# Patient Record
Sex: Female | Born: 1945 | Race: Black or African American | Hispanic: No | Smoking: Never smoker
Health system: Southern US, Community
[De-identification: ages and names within clinical notes are randomized; demographics above are authoritative.]

## PROBLEM LIST (undated history)

## (undated) DIAGNOSIS — I10 Essential (primary) hypertension: Secondary | ICD-10-CM

## (undated) DIAGNOSIS — M1990 Unspecified osteoarthritis, unspecified site: Secondary | ICD-10-CM

## (undated) DIAGNOSIS — E785 Hyperlipidemia, unspecified: Secondary | ICD-10-CM

## (undated) HISTORY — DX: Unspecified osteoarthritis, unspecified site: M19.90

## (undated) HISTORY — PX: ABDOMINAL HYSTERECTOMY: SHX81

## (undated) HISTORY — PX: BREAST BIOPSY: SHX20

---

## 2008-06-12 ENCOUNTER — Ambulatory Visit: Payer: Self-pay | Admitting: Internal Medicine

## 2009-06-14 ENCOUNTER — Ambulatory Visit: Payer: Self-pay | Admitting: Internal Medicine

## 2010-06-17 ENCOUNTER — Ambulatory Visit: Payer: Self-pay | Admitting: Internal Medicine

## 2011-05-12 ENCOUNTER — Ambulatory Visit: Payer: Self-pay | Admitting: Gastroenterology

## 2011-05-14 LAB — PATHOLOGY REPORT

## 2011-07-23 ENCOUNTER — Ambulatory Visit: Payer: Self-pay | Admitting: Internal Medicine

## 2012-07-23 ENCOUNTER — Ambulatory Visit: Payer: Self-pay | Admitting: Internal Medicine

## 2013-07-26 ENCOUNTER — Ambulatory Visit: Payer: Self-pay | Admitting: Internal Medicine

## 2014-05-07 DIAGNOSIS — D72819 Decreased white blood cell count, unspecified: Secondary | ICD-10-CM | POA: Insufficient documentation

## 2014-05-07 DIAGNOSIS — E538 Deficiency of other specified B group vitamins: Secondary | ICD-10-CM | POA: Insufficient documentation

## 2014-05-07 DIAGNOSIS — E7800 Pure hypercholesterolemia, unspecified: Secondary | ICD-10-CM | POA: Insufficient documentation

## 2014-05-07 DIAGNOSIS — M170 Bilateral primary osteoarthritis of knee: Secondary | ICD-10-CM | POA: Insufficient documentation

## 2014-09-26 ENCOUNTER — Ambulatory Visit: Payer: Self-pay | Admitting: Internal Medicine

## 2015-05-21 ENCOUNTER — Ambulatory Visit
Admission: EM | Admit: 2015-05-21 | Discharge: 2015-05-21 | Disposition: A | Discharge status: Home / Self Care | Payer: Medicare Other | Attending: Family Medicine | Admitting: Family Medicine

## 2015-05-21 DIAGNOSIS — M5431 Sciatica, right side: Secondary | ICD-10-CM

## 2015-05-21 HISTORY — DX: Hyperlipidemia, unspecified: E78.5

## 2015-05-21 HISTORY — DX: Essential (primary) hypertension: I10

## 2015-05-21 MED ORDER — HYDROCODONE-ACETAMINOPHEN 5-325 MG PO TABS: 1.0000 | ORAL_TABLET | Freq: Every evening | ORAL | Status: DC | PRN

## 2015-05-21 MED ORDER — PREDNISONE 20 MG PO TABS: 20.0000 mg | ORAL_TABLET | Freq: Every day | ORAL | Status: DC

## 2015-05-21 NOTE — ED Provider Notes (Signed)
CSN: [id number]     Arrival date & time 05/21/15  1828 History   First MD Initiated Contact with Patient 05/21/15 1922     Chief Complaint  Patient presents with  . Back Pain   (Consider location/radiation/quality/duration/timing/severity/associated sxs/prior Treatment) HPI Comments: 69 yo female with a 1 week h/o right lower back pain radiating down the right buttock. Denies any fall/injuries, trauma, fevers, chills, numbness/tingling. 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   The history is provided by the patient.    Past Medical History  Diagnosis Date  . Hypertension   . Hyperlipemia    Past Surgical History  Procedure Laterality Date  . Abdominal hysterectomy     No family history on file. Social History  Substance Use Topics  . Smoking status: Never Smoker   . Smokeless tobacco: None  . Alcohol Use: No   OB History    No data available     Review of Systems  Allergies  Review of patient's allergies indicates no known allergies.  Home Medications   Prior to Admission medications   Medication Sig Start Date End Date Taking? Authorizing Provider  calcium citrate-vitamin D (CITRACAL+D) 315-200 MG-UNIT tablet Take 1 tablet by mouth 2 (two) times daily.   Yes Historical Provider, MD  losartan-hydrochlorothiazide (HYZAAR) 100-25 MG tablet Take 1 tablet by mouth daily.   Yes Historical Provider, MD  simvastatin (ZOCOR) 10 MG tablet Take 10 mg by mouth daily.   Yes Historical Provider, MD  Thiamine HCl (VITAMIN B-1) 250 MG tablet Take 250 mg by mouth daily.   Yes Historical Provider, MD  verapamil (VERELAN PM) 240 MG 24 hr capsule Take 240 mg by mouth at bedtime.   Yes Historical Provider, MD  HYDROcodone-acetaminophen (NORCO/VICODIN) 5-325 MG tablet Take 1-2 tablets by mouth at bedtime as needed for severe pain. 05/21/15   Barbaraann Barthelina A Betancourt, NP  predniSONE (DELTASONE) 20 MG tablet Take 1 tablet (20 mg total) by mouth daily. 05/21/15   Payton Mccallumrlando Herb Beltre, MD   Meds  Ordered and Administered this Visit  Medications - No data to display  BP 130/79 mmHg  Pulse 70  Temp(Src) 98.1 F (36.7 C) (Oral)  Resp 16  Ht 5\' 7"  (1.702 m)  Wt 235 lb (106.595 kg)  BMI 36.80 kg/m2  SpO2 98% No data found.   Physical Exam  Constitutional: She appears well-developed and well-nourished. No distress.  Musculoskeletal: She exhibits no edema.       Lumbar back: She exhibits tenderness. She exhibits normal range of motion, no bony tenderness, no swelling, no edema, no deformity, no laceration, no pain, no spasm and normal pulse.  Tenderness to palpation over the right lumbar sacral paraspinous muscles and right buttock  Skin: No rash noted. She is not diaphoretic.  Nursing note and vitals reviewed.   ED Course  Procedures (including critical care time)  Labs Review Labs Reviewed - No data to display  Imaging Review No results found.   Visual Acuity Review  Right Eye Distance:   Left Eye Distance:   Bilateral Distance:    Right Eye Near:   Left Eye Near:    Bilateral Near:         MDM   1. Sciatica, right     Discharge Medication List as of 05/21/2015  7:45 PM    START taking these medications   Details  HYDROcodone-acetaminophen (NORCO/VICODIN) 5-325 MG tablet Take 1-2 tablets by mouth at bedtime as needed for severe pain., Starting 05/21/2015, Until  Discontinued, Print    predniSONE (DELTASONE) 20 MG tablet Take 1 tablet (20 mg total) by mouth daily., Starting 05/21/2015, Until Discontinued, Normal      1.  diagnosis reviewed with patient 2. rx as per orders above; reviewed possible side effects, interactions, risks and benefits  3. Recommend supportive treatment with otc analgesics prn, rest, heat 4. Follow prn if symptoms worsen or don't improve  Payton Mccallum, MD 05/23/15 2132

## 2015-05-21 NOTE — ED Notes (Signed)
Pt states "I have right lower back pain that is shooting down my right leg. I was doing a lot of heavy lifting with my son who had cancer, he passed away 9-1."

## 2015-07-12 ENCOUNTER — Other Ambulatory Visit: Payer: Self-pay | Admitting: Internal Medicine

## 2015-07-12 DIAGNOSIS — Z1231 Encounter for screening mammogram for malignant neoplasm of breast: Secondary | ICD-10-CM

## 2015-07-12 DIAGNOSIS — Z6836 Body mass index (BMI) 36.0-36.9, adult: Secondary | ICD-10-CM | POA: Insufficient documentation

## 2015-10-02 ENCOUNTER — Ambulatory Visit
Admission: RE | Admit: 2015-10-02 | Discharge: 2015-10-02 | Disposition: A | Discharge status: Home / Self Care | Payer: Commercial Managed Care - HMO | Source: Ambulatory Visit | Attending: Internal Medicine | Admitting: Internal Medicine

## 2015-10-02 DIAGNOSIS — Z1231 Encounter for screening mammogram for malignant neoplasm of breast: Secondary | ICD-10-CM | POA: Diagnosis not present

## 2016-01-10 DIAGNOSIS — M170 Bilateral primary osteoarthritis of knee: Secondary | ICD-10-CM | POA: Diagnosis not present

## 2016-01-10 DIAGNOSIS — I10 Essential (primary) hypertension: Secondary | ICD-10-CM | POA: Diagnosis not present

## 2016-01-10 DIAGNOSIS — Z780 Asymptomatic menopausal state: Secondary | ICD-10-CM | POA: Diagnosis not present

## 2016-01-10 DIAGNOSIS — E538 Deficiency of other specified B group vitamins: Secondary | ICD-10-CM | POA: Diagnosis not present

## 2016-01-10 DIAGNOSIS — Z79899 Other long term (current) drug therapy: Secondary | ICD-10-CM | POA: Diagnosis not present

## 2016-01-10 DIAGNOSIS — E7800 Pure hypercholesterolemia, unspecified: Secondary | ICD-10-CM | POA: Diagnosis not present

## 2016-01-25 DIAGNOSIS — Z780 Asymptomatic menopausal state: Secondary | ICD-10-CM | POA: Diagnosis not present

## 2016-01-25 LAB — HM DEXA SCAN: HM Dexa Scan: NORMAL

## 2016-02-11 DIAGNOSIS — M8589 Other specified disorders of bone density and structure, multiple sites: Secondary | ICD-10-CM | POA: Insufficient documentation

## 2016-02-11 DIAGNOSIS — M810 Age-related osteoporosis without current pathological fracture: Secondary | ICD-10-CM | POA: Insufficient documentation

## 2016-07-14 ENCOUNTER — Other Ambulatory Visit: Payer: Self-pay | Admitting: Internal Medicine

## 2016-07-14 DIAGNOSIS — Z1231 Encounter for screening mammogram for malignant neoplasm of breast: Secondary | ICD-10-CM

## 2016-07-14 DIAGNOSIS — Z79899 Other long term (current) drug therapy: Secondary | ICD-10-CM | POA: Diagnosis not present

## 2016-07-14 DIAGNOSIS — D72819 Decreased white blood cell count, unspecified: Secondary | ICD-10-CM | POA: Diagnosis not present

## 2016-07-14 DIAGNOSIS — E7800 Pure hypercholesterolemia, unspecified: Secondary | ICD-10-CM | POA: Diagnosis not present

## 2016-07-14 DIAGNOSIS — I10 Essential (primary) hypertension: Secondary | ICD-10-CM | POA: Diagnosis not present

## 2016-07-14 DIAGNOSIS — Z23 Encounter for immunization: Secondary | ICD-10-CM | POA: Diagnosis not present

## 2016-07-14 DIAGNOSIS — E538 Deficiency of other specified B group vitamins: Secondary | ICD-10-CM | POA: Diagnosis not present

## 2016-10-02 ENCOUNTER — Ambulatory Visit
Admission: RE | Admit: 2016-10-02 | Discharge: 2016-10-02 | Disposition: A | Discharge status: Home / Self Care | Payer: Medicare HMO | Source: Ambulatory Visit | Attending: Internal Medicine | Admitting: Internal Medicine

## 2016-10-02 DIAGNOSIS — Z1231 Encounter for screening mammogram for malignant neoplasm of breast: Secondary | ICD-10-CM | POA: Diagnosis not present

## 2016-12-25 DIAGNOSIS — N390 Urinary tract infection, site not specified: Secondary | ICD-10-CM | POA: Diagnosis not present

## 2017-01-12 DIAGNOSIS — Z79899 Other long term (current) drug therapy: Secondary | ICD-10-CM | POA: Diagnosis not present

## 2017-01-12 DIAGNOSIS — E538 Deficiency of other specified B group vitamins: Secondary | ICD-10-CM | POA: Diagnosis not present

## 2017-01-12 DIAGNOSIS — M170 Bilateral primary osteoarthritis of knee: Secondary | ICD-10-CM | POA: Diagnosis not present

## 2017-01-12 DIAGNOSIS — I10 Essential (primary) hypertension: Secondary | ICD-10-CM | POA: Diagnosis not present

## 2017-01-12 DIAGNOSIS — E7800 Pure hypercholesterolemia, unspecified: Secondary | ICD-10-CM | POA: Diagnosis not present

## 2017-01-12 DIAGNOSIS — D72819 Decreased white blood cell count, unspecified: Secondary | ICD-10-CM | POA: Diagnosis not present

## 2017-06-03 ENCOUNTER — Encounter: Payer: Self-pay | Admitting: *Deleted

## 2017-06-03 ENCOUNTER — Ambulatory Visit
Admission: EM | Admit: 2017-06-03 | Discharge: 2017-06-03 | Disposition: A | Discharge status: Home / Self Care | Payer: Medicare HMO | Attending: Family Medicine | Admitting: Family Medicine

## 2017-06-03 DIAGNOSIS — R319 Hematuria, unspecified: Secondary | ICD-10-CM

## 2017-06-03 DIAGNOSIS — N390 Urinary tract infection, site not specified: Secondary | ICD-10-CM

## 2017-06-03 LAB — URINALYSIS, COMPLETE (UACMP) WITH MICROSCOPIC
Bilirubin Urine: NEGATIVE
Glucose, UA: NEGATIVE mg/dL
KETONES UR: NEGATIVE mg/dL
Nitrite: NEGATIVE
PROTEIN: NEGATIVE mg/dL
Specific Gravity, Urine: 1.025 (ref 1.005–1.030)
pH: 6 (ref 5.0–8.0)

## 2017-06-03 MED ORDER — NITROFURANTOIN MONOHYD MACRO 100 MG PO CAPS: 100.0000 mg | ORAL_CAPSULE | Freq: Two times a day (BID) | ORAL | 0 refills | Status: DC

## 2017-06-03 NOTE — ED Provider Notes (Signed)
[facility]    CSN: [id number] Arrival date & time: 06/03/17  0911     History   Chief Complaint Chief Complaint  Patient presents with  . Dysuria    HPI Elizabeth Franco is a 71 y.o. female.   Patient is a 71 year old female who presents with burning with urination that started on Sunday. Patient ports of burning is worse at the beginning. She is drinking the juice and water without much improvement. Patient denies abdominal pain nausea and vomiting. Her last UTI was in May that she attributes to delaying urination due to travel. She is taken no over-the-counter medications for this and has no allergies to medications. She reports is no blood or foul odor to her urine just that is yellow and not clear.      Past Medical History:  Diagnosis Date  . Hyperlipemia   . Hypertension     There are no active problems to display for this patient.   Past Surgical History:  Procedure Laterality Date  . ABDOMINAL HYSTERECTOMY    . BREAST BIOPSY Left    neg    OB History    No data available       Home Medications    Prior to Admission medications   Medication Sig Start Date End Date Taking? Authorizing Provider  calcium citrate-vitamin D (CITRACAL+D) 315-200 MG-UNIT tablet Take 1 tablet by mouth 2 (two) times daily.   Yes [provider]  losartan-hydrochlorothiazide (HYZAAR) 100-25 MG tablet Take 1 tablet by mouth daily.   Yes [provider]  simvastatin (ZOCOR) 10 MG tablet Take 10 mg by mouth daily.   Yes [provider]  Thiamine HCl (VITAMIN B-1) 250 MG tablet Take 250 mg by mouth daily.   Yes [provider]  HYDROcodone-acetaminophen (NORCO/VICODIN) 5-325 MG tablet Take 1-2 tablets by mouth at bedtime as needed for severe pain. 05/21/15   Betancourt, Jarold Song, NP  nitrofurantoin, macrocrystal-monohydrate, (MACROBID) 100 MG capsule Take 1 capsule (100 mg total) by mouth 2 (two) times daily. 06/03/17   Candis Schatz, PA-C  predniSONE (DELTASONE) 20 MG tablet Take 1 tablet (20 mg total) by mouth daily. 05/21/15   Payton Mccallum, MD  verapamil (VERELAN PM) 240 MG 24 hr capsule Take 240 mg by mouth at bedtime.    [provider]    Family History Family History  Problem Relation Age of Onset  . Breast cancer Mother 67  . Breast cancer Maternal Aunt   . Breast cancer Maternal Grandmother 15    Social History Social History  Substance Use Topics  . Smoking status: Never Smoker  . Smokeless tobacco: Never Used  . Alcohol use No     Allergies   Patient has no known allergies.   Review of Systems Review of Systems  As noted above in history of present illness. Other systems reviewed and found to be negative.   Physical Exam Triage Vital Signs ED Triage Vitals  Enc Vitals Group     BP      Pulse      Resp      Temp      Temp src      SpO2      Weight      Height      Head Circumference      Peak Flow      Pain Score      Pain Loc      Pain Edu?  Excl. in GC?    No data found.   Updated Vital Signs BP 119/73 (BP Location: Left Arm)   Pulse 95   Temp 98.2 F (36.8 C) (Oral)   Resp 14   Ht 5\' 7"  (1.702 m)   Wt 230 lb (104.3 kg)   SpO2 98%   BMI 36.02 kg/m    Physical Exam  Constitutional: She is oriented to person, place, and time. She appears well-developed and well-nourished. No distress.  HENT:  Head: Atraumatic.  Eyes: Pupils are equal, round, and reactive to light. EOM are normal.  Neck: Normal range of motion.  Cardiovascular: Normal rate, regular rhythm and normal heart sounds.   No murmur heard. Pulmonary/Chest: Effort normal. No respiratory distress. She has no wheezes.  Abdominal: Soft. Bowel sounds are normal. She exhibits no distension. There is no tenderness.  Musculoskeletal: Normal range of motion. She exhibits no edema.  Neurological: She is alert and oriented to person, place, and time. No cranial nerve deficit.  Skin: Skin is warm  and dry.     UC Treatments / Results  Labs (all labs ordered are listed, but only abnormal results are displayed) Labs Reviewed  URINALYSIS, COMPLETE (UACMP) WITH MICROSCOPIC - Abnormal; Notable for the following:       Result Value   APPearance HAZY (*)    Hgb urine dipstick MODERATE (*)    Leukocytes, UA MODERATE (*)    Squamous Epithelial / LPF 0-5 (*)    Non Squamous Epithelial PRESENT (*)    Bacteria, UA RARE (*)    All other components within normal limits    EKG  EKG Interpretation None       Radiology No results found.  Procedures Procedures (including critical care time)  Medications Ordered in UC Medications - No data to display   Initial Impression / Assessment and Plan / UC Course  I have reviewed the triage vital signs and the nursing notes.  Pertinent labs & imaging results that were available during my care of the patient were reviewed by me and considered in my medical decision making (see chart for details).     Patient presents with symptoms of UTI.  Final Clinical Impressions(s) / UC Diagnoses   Final diagnoses:  Urinary tract infection with hematuria, site unspecified   Positive UTI with hematuria. Culture. Will treat with Macrobid which was successful in the past.  New Prescriptions New Prescriptions   NITROFURANTOIN, MACROCRYSTAL-MONOHYDRATE, (MACROBID) 100 MG CAPSULE    Take 1 capsule (100 mg total) by mouth 2 (two) times daily.     Controlled Substance Prescriptions [place] Controlled Substance Registry consulted? Not Applicable   Candis SchatzHarris, Hugh Kamara D, PA-C 06/03/17 1005

## 2017-06-03 NOTE — ED Triage Notes (Signed)
Pt is having pain when urinating. Symptoms started on Sunday.

## 2017-06-03 NOTE — Discharge Instructions (Signed)
-  Macrobid: one tablet twice a day for 5 days -push fluids -can use Tylenol or ibuprofen for pain -can follow up with PCP or this clinic should symptoms worsen or not improve

## 2017-06-04 LAB — URINE CULTURE

## 2017-08-06 ENCOUNTER — Other Ambulatory Visit: Payer: Self-pay | Admitting: Internal Medicine

## 2017-08-06 DIAGNOSIS — E538 Deficiency of other specified B group vitamins: Secondary | ICD-10-CM | POA: Diagnosis not present

## 2017-08-06 DIAGNOSIS — Z803 Family history of malignant neoplasm of breast: Secondary | ICD-10-CM | POA: Diagnosis not present

## 2017-08-06 DIAGNOSIS — D72819 Decreased white blood cell count, unspecified: Secondary | ICD-10-CM | POA: Diagnosis not present

## 2017-08-06 DIAGNOSIS — E7800 Pure hypercholesterolemia, unspecified: Secondary | ICD-10-CM | POA: Diagnosis not present

## 2017-08-06 DIAGNOSIS — Z1239 Encounter for other screening for malignant neoplasm of breast: Secondary | ICD-10-CM

## 2017-08-06 DIAGNOSIS — Z1231 Encounter for screening mammogram for malignant neoplasm of breast: Secondary | ICD-10-CM | POA: Diagnosis not present

## 2017-08-06 DIAGNOSIS — I10 Essential (primary) hypertension: Secondary | ICD-10-CM | POA: Diagnosis not present

## 2017-08-06 DIAGNOSIS — Z0000 Encounter for general adult medical examination without abnormal findings: Secondary | ICD-10-CM | POA: Diagnosis not present

## 2017-08-06 DIAGNOSIS — M170 Bilateral primary osteoarthritis of knee: Secondary | ICD-10-CM | POA: Diagnosis not present

## 2017-08-06 DIAGNOSIS — Z79899 Other long term (current) drug therapy: Secondary | ICD-10-CM | POA: Diagnosis not present

## 2017-10-06 ENCOUNTER — Ambulatory Visit
Admission: RE | Admit: 2017-10-06 | Discharge: 2017-10-06 | Disposition: A | Discharge status: Home / Self Care | Payer: Medicare HMO | Source: Ambulatory Visit | Attending: Internal Medicine | Admitting: Internal Medicine

## 2017-10-06 DIAGNOSIS — Z803 Family history of malignant neoplasm of breast: Secondary | ICD-10-CM

## 2017-10-06 DIAGNOSIS — Z1231 Encounter for screening mammogram for malignant neoplasm of breast: Secondary | ICD-10-CM | POA: Insufficient documentation

## 2017-10-06 DIAGNOSIS — Z1239 Encounter for other screening for malignant neoplasm of breast: Secondary | ICD-10-CM

## 2018-01-29 ENCOUNTER — Other Ambulatory Visit: Payer: Self-pay | Admitting: Pharmacist

## 2018-01-29 NOTE — Patient Outreach (Signed)
[facility] Care Management  01/29/2018  Elizabeth Franco 1946-04-08 [id number]   Incoming call from Elizabeth CruelBetty Jean Schuld in response to the [facility] Medication Adherence Campaign. Speak with patient. HIPAA identifiers verified and verbal consent received.  Elizabeth Franco reports that she takes her simvastatin once daily as directed. Denies any missed doses or barriers to adherence. Counsel patient on the importance of adherence. Elizabeth Franco verbalizes understanding.   Patient denies any medication questions/concerns. Will close pharmacy episode at this time.  Duanne MoronElisabeth Ariannah Arenson, PharmD, [facility]ACP Clinical Pharmacist [facility] Care Management [phone]

## 2018-02-03 DIAGNOSIS — E538 Deficiency of other specified B group vitamins: Secondary | ICD-10-CM | POA: Diagnosis not present

## 2018-02-03 DIAGNOSIS — I10 Essential (primary) hypertension: Secondary | ICD-10-CM | POA: Diagnosis not present

## 2018-02-03 DIAGNOSIS — Z79899 Other long term (current) drug therapy: Secondary | ICD-10-CM | POA: Diagnosis not present

## 2018-02-03 DIAGNOSIS — E7800 Pure hypercholesterolemia, unspecified: Secondary | ICD-10-CM | POA: Diagnosis not present

## 2018-02-03 DIAGNOSIS — M170 Bilateral primary osteoarthritis of knee: Secondary | ICD-10-CM | POA: Diagnosis not present

## 2018-08-06 DIAGNOSIS — M170 Bilateral primary osteoarthritis of knee: Secondary | ICD-10-CM | POA: Diagnosis not present

## 2018-08-06 DIAGNOSIS — E538 Deficiency of other specified B group vitamins: Secondary | ICD-10-CM | POA: Diagnosis not present

## 2018-08-06 DIAGNOSIS — I10 Essential (primary) hypertension: Secondary | ICD-10-CM | POA: Diagnosis not present

## 2018-08-06 DIAGNOSIS — D72819 Decreased white blood cell count, unspecified: Secondary | ICD-10-CM | POA: Diagnosis not present

## 2018-08-06 DIAGNOSIS — Z0000 Encounter for general adult medical examination without abnormal findings: Secondary | ICD-10-CM | POA: Diagnosis not present

## 2018-08-06 DIAGNOSIS — E7800 Pure hypercholesterolemia, unspecified: Secondary | ICD-10-CM | POA: Diagnosis not present

## 2018-08-06 DIAGNOSIS — Z803 Family history of malignant neoplasm of breast: Secondary | ICD-10-CM | POA: Diagnosis not present

## 2018-08-06 DIAGNOSIS — Z79899 Other long term (current) drug therapy: Secondary | ICD-10-CM | POA: Diagnosis not present

## 2018-08-06 DIAGNOSIS — Z1231 Encounter for screening mammogram for malignant neoplasm of breast: Secondary | ICD-10-CM | POA: Diagnosis not present

## 2018-08-09 ENCOUNTER — Other Ambulatory Visit: Payer: Self-pay | Admitting: Internal Medicine

## 2018-08-09 DIAGNOSIS — Z803 Family history of malignant neoplasm of breast: Secondary | ICD-10-CM

## 2018-08-09 DIAGNOSIS — Z1231 Encounter for screening mammogram for malignant neoplasm of breast: Secondary | ICD-10-CM

## 2018-10-11 ENCOUNTER — Ambulatory Visit
Admission: RE | Admit: 2018-10-11 | Discharge: 2018-10-11 | Disposition: A | Discharge status: Home / Self Care | Payer: Medicare HMO | Source: Ambulatory Visit | Attending: Internal Medicine | Admitting: Internal Medicine

## 2018-10-11 ENCOUNTER — Other Ambulatory Visit: Payer: Self-pay

## 2018-10-11 DIAGNOSIS — Z1231 Encounter for screening mammogram for malignant neoplasm of breast: Secondary | ICD-10-CM

## 2018-10-11 DIAGNOSIS — Z803 Family history of malignant neoplasm of breast: Secondary | ICD-10-CM | POA: Insufficient documentation

## 2019-02-07 DIAGNOSIS — I10 Essential (primary) hypertension: Secondary | ICD-10-CM | POA: Diagnosis not present

## 2019-02-07 DIAGNOSIS — M170 Bilateral primary osteoarthritis of knee: Secondary | ICD-10-CM | POA: Diagnosis not present

## 2019-02-07 DIAGNOSIS — Z79899 Other long term (current) drug therapy: Secondary | ICD-10-CM | POA: Diagnosis not present

## 2019-02-07 DIAGNOSIS — M19042 Primary osteoarthritis, left hand: Secondary | ICD-10-CM | POA: Diagnosis not present

## 2019-02-07 DIAGNOSIS — E538 Deficiency of other specified B group vitamins: Secondary | ICD-10-CM | POA: Diagnosis not present

## 2019-02-07 DIAGNOSIS — E7800 Pure hypercholesterolemia, unspecified: Secondary | ICD-10-CM | POA: Diagnosis not present

## 2019-06-09 DIAGNOSIS — Z20828 Contact with and (suspected) exposure to other viral communicable diseases: Secondary | ICD-10-CM | POA: Diagnosis not present

## 2019-08-11 ENCOUNTER — Other Ambulatory Visit: Payer: Self-pay | Admitting: Internal Medicine

## 2019-08-11 DIAGNOSIS — Z1231 Encounter for screening mammogram for malignant neoplasm of breast: Secondary | ICD-10-CM

## 2019-08-11 DIAGNOSIS — E538 Deficiency of other specified B group vitamins: Secondary | ICD-10-CM | POA: Diagnosis not present

## 2019-08-11 DIAGNOSIS — M170 Bilateral primary osteoarthritis of knee: Secondary | ICD-10-CM | POA: Diagnosis not present

## 2019-08-11 DIAGNOSIS — Z0000 Encounter for general adult medical examination without abnormal findings: Secondary | ICD-10-CM | POA: Diagnosis not present

## 2019-08-11 DIAGNOSIS — Z79899 Other long term (current) drug therapy: Secondary | ICD-10-CM | POA: Diagnosis not present

## 2019-08-11 DIAGNOSIS — Z803 Family history of malignant neoplasm of breast: Secondary | ICD-10-CM | POA: Diagnosis not present

## 2019-08-11 DIAGNOSIS — M19049 Primary osteoarthritis, unspecified hand: Secondary | ICD-10-CM | POA: Diagnosis not present

## 2019-08-11 DIAGNOSIS — I10 Essential (primary) hypertension: Secondary | ICD-10-CM | POA: Diagnosis not present

## 2019-08-11 DIAGNOSIS — Z1331 Encounter for screening for depression: Secondary | ICD-10-CM | POA: Diagnosis not present

## 2019-08-12 DIAGNOSIS — Z79899 Other long term (current) drug therapy: Secondary | ICD-10-CM | POA: Diagnosis not present

## 2019-08-12 DIAGNOSIS — E7800 Pure hypercholesterolemia, unspecified: Secondary | ICD-10-CM | POA: Diagnosis not present

## 2019-08-12 DIAGNOSIS — Z1159 Encounter for screening for other viral diseases: Secondary | ICD-10-CM | POA: Diagnosis not present

## 2019-08-12 DIAGNOSIS — E538 Deficiency of other specified B group vitamins: Secondary | ICD-10-CM | POA: Diagnosis not present

## 2019-08-12 LAB — HM HEPATITIS C SCREENING LAB: HM Hepatitis Screen: NEGATIVE

## 2019-08-12 LAB — HM DEXA SCAN

## 2019-11-02 ENCOUNTER — Ambulatory Visit: Payer: Medicare HMO

## 2019-11-29 ENCOUNTER — Other Ambulatory Visit: Payer: Self-pay

## 2019-11-29 ENCOUNTER — Encounter (INDEPENDENT_AMBULATORY_CARE_PROVIDER_SITE_OTHER): Payer: Self-pay

## 2019-11-29 ENCOUNTER — Ambulatory Visit
Admission: RE | Admit: 2019-11-29 | Discharge: 2019-11-29 | Disposition: A | Discharge status: Home / Self Care | Payer: Medicare HMO | Source: Ambulatory Visit | Attending: Internal Medicine | Admitting: Internal Medicine

## 2019-11-29 DIAGNOSIS — Z803 Family history of malignant neoplasm of breast: Secondary | ICD-10-CM | POA: Diagnosis not present

## 2019-11-29 DIAGNOSIS — Z1231 Encounter for screening mammogram for malignant neoplasm of breast: Secondary | ICD-10-CM | POA: Diagnosis not present

## 2020-02-09 DIAGNOSIS — M170 Bilateral primary osteoarthritis of knee: Secondary | ICD-10-CM | POA: Diagnosis not present

## 2020-02-09 DIAGNOSIS — Z79899 Other long term (current) drug therapy: Secondary | ICD-10-CM | POA: Diagnosis not present

## 2020-02-09 DIAGNOSIS — I10 Essential (primary) hypertension: Secondary | ICD-10-CM | POA: Diagnosis not present

## 2020-02-09 DIAGNOSIS — E538 Deficiency of other specified B group vitamins: Secondary | ICD-10-CM | POA: Diagnosis not present

## 2020-02-09 DIAGNOSIS — E7800 Pure hypercholesterolemia, unspecified: Secondary | ICD-10-CM | POA: Diagnosis not present

## 2020-07-01 ENCOUNTER — Encounter: Payer: Self-pay | Admitting: Emergency Medicine

## 2020-07-01 ENCOUNTER — Ambulatory Visit
Admission: EM | Admit: 2020-07-01 | Discharge: 2020-07-01 | Disposition: A | Discharge status: Home / Self Care | Payer: Medicare HMO | Attending: Emergency Medicine | Admitting: Emergency Medicine

## 2020-07-01 ENCOUNTER — Other Ambulatory Visit: Payer: Self-pay

## 2020-07-01 DIAGNOSIS — M5431 Sciatica, right side: Secondary | ICD-10-CM | POA: Diagnosis not present

## 2020-07-01 MED ORDER — DEXAMETHASONE SODIUM PHOSPHATE 10 MG/ML IJ SOLN
10.0000 mg | Freq: Once | INTRAMUSCULAR | Status: AC
  Administered 2020-07-01: 10 mg via INTRAMUSCULAR

## 2020-07-01 MED ORDER — PREDNISONE 10 MG (21) PO TBPK: ORAL_TABLET | ORAL | 0 refills | Status: DC

## 2020-07-01 NOTE — Discharge Instructions (Signed)
You were seen for right hip pain and are being treated for sciatica.   Start your steroid pack tomorrow, 11/29.  Treat your pain with over-the-counter Tylenol, 2 650 mg tablets 2 times a day as needed.  Take care, Dr. Sharlet Salina, NP-c

## 2020-07-01 NOTE — ED Triage Notes (Signed)
Pt c/o right hip pain onset about 3 days ago. Pt states she had to carry a 14 lbs [place] up and down the stairs before symptoms began. She has used arthritis cream and tylenol arthritis with no relief.

## 2020-07-01 NOTE — ED Provider Notes (Signed)
[facility] [place]   Name: Elizabeth Franco DOB: 09-12-1945 MRN: [id number] CSN: [id number] PCP: Mickey Farber, MD  Arrival date and time:  07/01/20 1104  Chief Complaint:  Hip Pain   NOTE: Prior to seeing the patient today, I have reviewed the triage nursing documentation and vital signs. Clinical staff has updated patient's PMH/PSHx, current medication list, and drug allergies/intolerances to ensure comprehensive history available to assist in medical decision making.   History:   HPI: Elizabeth Franco is a 74 y.o. female who presents today with complaints of right-sided hip pain.  Patient states his right hip pain started after carrying a 14 pound [place] down 2 flights of stairs and into her car on Thanksgiving day.  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  She received "some medications" from this urgent care and that helped with her pain.  Patient states his pain starts at her right gluteal/hip and radiates down her leg.  She denies any numbness or weakness to her right leg.  She denies any lower back pain, or loss of bowel or bladder function.  Past Medical History:  Diagnosis Date   Hyperlipemia    Hypertension     Past Surgical History:  Procedure Laterality Date   ABDOMINAL HYSTERECTOMY     BREAST BIOPSY Left    neg    Family History  Problem Relation Age of Onset   Breast cancer Mother 15   Breast cancer Maternal Aunt    Breast cancer Maternal Grandmother 52    Social History   Tobacco Use   Smoking status: Never Smoker   Smokeless tobacco: Never Used  Substance Use Topics   Alcohol use: No   Drug use: Not on file    There are no problems to display for this patient.   Home Medications:    Current Meds  Medication Sig   Ascorbic Acid (VITAMIN C) 1000 MG tablet Take 1,000 mg by mouth daily.   calcium citrate-vitamin D (CITRACAL+D) 315-200 MG-UNIT tablet Take 1 tablet by  mouth 2 (two) times daily.   losartan-hydrochlorothiazide (HYZAAR) 100-25 MG tablet Take 1 tablet by mouth daily.   Omega-3 Fatty Acids (FISH OIL) 1000 MG CAPS Take by mouth.   simvastatin (ZOCOR) 10 MG tablet Take 10 mg by mouth daily.   verapamil (VERELAN PM) 240 MG 24 hr capsule Take 240 mg by mouth at bedtime.   vitamin B-12 (CYANOCOBALAMIN) 1000 MCG tablet Take 1,000 mcg by mouth daily.   [DISCONTINUED] HYDROcodone-acetaminophen (NORCO/VICODIN) 5-325 MG tablet Take 1-2 tablets by mouth at bedtime as needed for severe pain.    Allergies:   Patient has no known allergies.  Review of Systems (ROS): Review of Systems  Constitutional: Negative for activity change.  Musculoskeletal: Positive for arthralgias and myalgias. Negative for back pain and joint swelling.  Neurological: Negative for weakness.  All other systems reviewed and are negative.    Vital Signs: Today's Vitals   07/01/20 1124 07/01/20 1126  BP:  131/81  Pulse:  60  Temp:  98.8 F (37.1 C)  TempSrc:  Oral  SpO2:  98%  PainSc: 7      Physical Exam: Physical Exam Vitals and nursing note reviewed.  Constitutional:      Appearance: Normal appearance.  Cardiovascular:     Rate and Rhythm: Normal rate and regular rhythm.     Pulses:          Popliteal pulses  are 1+ on the right side.     Heart sounds: Murmur heard.  Systolic murmur is present with a grade of 3/6.   Pulmonary:     Breath sounds: Normal breath sounds.  Musculoskeletal:     Right hip: No tenderness or bony tenderness. Normal range of motion.     Left hip: Normal.     Comments: Tenderness to her right glued.  Full passive and active range of motion to right hip.  Skin:    General: Skin is warm and dry.  Neurological:     Mental Status: She is alert.  Psychiatric:        Mood and Affect: Mood normal.        Behavior: Behavior normal.        Thought Content: Thought content normal.      Urgent Care Treatments / Results:    LABS: PLEASE NOTE: all labs that were ordered this encounter are listed, however only abnormal results are displayed. Labs Reviewed - No data to display  EKG: -None  RADIOLOGY: No results found.  PROCEDURES: Procedures  MEDICATIONS RECEIVED THIS VISIT: Medications  dexamethasone (DECADRON) injection 10 mg (has no administration in time range)    PERTINENT CLINICAL COURSE NOTES/UPDATES:   Initial Impression / Assessment and Plan / Urgent Care Course:  Pertinent labs & imaging results that were available during my care of the patient were personally reviewed by me and considered in my medical decision making (see lab/imaging section of note for values and interpretations).  Elizabeth Franco is a 74 y.o. female who presents to [facility] today with complaints of right hip pain, diagnosed with right sciatic nerve inflammation, and treated as such with the medications below. NP and patient reviewed discharge instructions below during visit.   Patient is well appearing overall in clinic today. She does not appear to be in any acute distress. Presenting symptoms (see HPI) and exam as documented above.   I have reviewed the follow up and strict return precautions for any new or worsening symptoms. Patient is aware of symptoms that would be deemed urgent/emergent, and would thus require further evaluation either here or in the emergency department. At the time of discharge, she verbalized understanding and consent with the discharge plan as it was reviewed with her. All questions were fielded by provider and/or clinic staff prior to patient discharge.    Final Clinical Impressions / Urgent Care Diagnoses:   Final diagnoses:  Sciatica of right side    New Prescriptions:  [place] Controlled Substance Registry consulted? Not Applicable  Meds ordered this encounter  Medications   dexamethasone (DECADRON) injection 10 mg   predniSONE (STERAPRED UNI-PAK 21 TAB) 10 MG (21) TBPK  tablet    Sig: Take as instructed on package (60, 50, 40, 30, 20, 10)    Dispense:  1 each    Refill:  0     Discharge Instructions     You were seen for right hip pain and are being treated for sciatica.   Start your steroid pack tomorrow, 11/29.  Treat your pain with over-the-counter Tylenol, 2 650 mg tablets 2 times a day as needed.  Take care, Dr. Sharlet Salina, NP-c     Recommended Follow up Care:  Patient encouraged to follow up with the following provider within the specified time frame, or sooner as dictated by the severity of her symptoms. As always, she was instructed that for any urgent/emergent care needs, she should seek care either here  or in the emergency department for more immediate evaluation.   Bailey Mech, DNP, NP-c    Bailey Mech, NP 07/01/20 1735

## 2020-08-14 ENCOUNTER — Other Ambulatory Visit: Payer: Self-pay | Admitting: Internal Medicine

## 2020-08-14 DIAGNOSIS — Z1231 Encounter for screening mammogram for malignant neoplasm of breast: Secondary | ICD-10-CM

## 2020-08-15 LAB — LIPID PANEL
Cholesterol: 185 (ref 0–200)
HDL: 77 — AB (ref 35–70)
LDL Cholesterol: 87
Triglycerides: 109 (ref 40–160)

## 2020-08-15 LAB — CBC AND DIFFERENTIAL
HCT: 39 (ref 36–46)
Hemoglobin: 13 (ref 12.0–16.0)
Platelets: 289 (ref 150–399)
WBC: 4.2

## 2020-08-15 LAB — COMPREHENSIVE METABOLIC PANEL
Calcium: 11 — AB (ref 8.7–10.7)
GFR calc Af Amer: 146

## 2020-08-15 LAB — BASIC METABOLIC PANEL
BUN: 10 (ref 4–21)
CO2: 27 — AB (ref 13–22)
Chloride: 105 (ref 99–108)
Creatinine: 0.5 (ref 0.5–1.1)
Potassium: 3.7 (ref 3.4–5.3)
Sodium: 138 (ref 137–147)

## 2020-08-15 LAB — VITAMIN B12: Vitamin B-12: 1500

## 2020-11-29 ENCOUNTER — Other Ambulatory Visit: Payer: Self-pay

## 2020-11-29 ENCOUNTER — Ambulatory Visit
Admission: RE | Admit: 2020-11-29 | Discharge: 2020-11-29 | Disposition: A | Discharge status: Home / Self Care | Payer: Medicare HMO | Source: Ambulatory Visit | Attending: Internal Medicine | Admitting: Internal Medicine

## 2020-11-29 DIAGNOSIS — Z1231 Encounter for screening mammogram for malignant neoplasm of breast: Secondary | ICD-10-CM | POA: Diagnosis not present

## 2021-04-19 ENCOUNTER — Encounter: Payer: Self-pay | Admitting: Internal Medicine

## 2021-04-19 ENCOUNTER — Other Ambulatory Visit: Payer: Self-pay | Admitting: Internal Medicine

## 2021-04-19 DIAGNOSIS — I10 Essential (primary) hypertension: Secondary | ICD-10-CM | POA: Insufficient documentation

## 2021-04-24 ENCOUNTER — Other Ambulatory Visit: Payer: Self-pay

## 2021-04-24 ENCOUNTER — Other Ambulatory Visit: Payer: Self-pay | Admitting: Internal Medicine

## 2021-04-24 ENCOUNTER — Ambulatory Visit (INDEPENDENT_AMBULATORY_CARE_PROVIDER_SITE_OTHER): Payer: Medicare HMO | Admitting: Internal Medicine

## 2021-04-24 ENCOUNTER — Encounter: Payer: Self-pay | Admitting: Internal Medicine

## 2021-04-24 VITALS — BP 132/72 | HR 55 | Temp 98.1°F | Ht 67.0 in | Wt 229.0 lb

## 2021-04-24 DIAGNOSIS — M170 Bilateral primary osteoarthritis of knee: Secondary | ICD-10-CM

## 2021-04-24 DIAGNOSIS — Z23 Encounter for immunization: Secondary | ICD-10-CM

## 2021-04-24 DIAGNOSIS — I10 Essential (primary) hypertension: Secondary | ICD-10-CM | POA: Diagnosis not present

## 2021-04-24 DIAGNOSIS — E7800 Pure hypercholesterolemia, unspecified: Secondary | ICD-10-CM

## 2021-04-24 MED ORDER — VERAPAMIL HCL ER 180 MG PO TBCR: 180.0000 mg | EXTENDED_RELEASE_TABLET | Freq: Every day | ORAL | 0 refills | Status: DC

## 2021-04-24 NOTE — Progress Notes (Signed)
Date:  04/24/2021   Name:  Elizabeth Franco   DOB:  [birth year]   MRN:  [id number]   Chief Complaint: Establish Care, Flu Vaccine, and Hypertension (Pt wants tablet form of verapamil to lower cost of medication )  Hypertension This is a chronic problem. The problem is controlled. Pertinent negatives include no chest pain, headaches, palpitations or shortness of breath. Past treatments include calcium channel blockers, diuretics and angiotensin blockers. The current treatment provides significant improvement. There is no history of kidney disease, CAD/MI or CVA.  Hyperlipidemia This is a chronic problem. The problem is controlled. Pertinent negatives include no chest pain or shortness of breath. Current antihyperlipidemic treatment includes statins. The current treatment provides significant improvement of lipids. There are no compliance problems.    Immunization History  Administered Date(s) Administered   Fluad Quad(high Dose 65+) 05/03/2019, 04/24/2021   Influenza, High Dose Seasonal PF 05/04/2018   Influenza-Unspecified 06/16/2015, 06/04/2016, 05/11/2020   Moderna Sars-Covid-2 Vaccination 08/21/2019, 09/18/2019, 06/06/2020, 01/30/2021   Pneumococcal Conjugate-13 08/01/2016   Pneumococcal Polysaccharide-23 11/09/2012   Tdap 01/09/2015    Lab Results  Component Value Date   CREATININE 0.5 08/15/2020   BUN 10 08/15/2020   NA 138 08/15/2020   K 3.7 08/15/2020   CL 105 08/15/2020   CO2 27 (A) 08/15/2020   Lab Results  Component Value Date   CHOL 185 08/15/2020   HDL 77 (A) 08/15/2020   LDLCALC 87 08/15/2020   TRIG 109 08/15/2020   No results found for: TSH No results found for: HGBA1C Lab Results  Component Value Date   WBC 4.2 08/15/2020   HGB 13.0 08/15/2020   HCT 39 08/15/2020   PLT 289 08/15/2020   No results found for: ALT, AST, GGT, ALKPHOS, BILITOT   Review of Systems  Constitutional:  Negative for chills, diaphoresis and unexpected weight change.  Eyes:   Negative for visual disturbance.  Respiratory:  Negative for cough, chest tightness, shortness of breath and wheezing.   Cardiovascular:  Negative for chest pain, palpitations and leg swelling.  Gastrointestinal:  Negative for abdominal pain (occasional gerd), constipation and diarrhea.  Musculoskeletal:  Positive for arthralgias (knee OA).  Neurological:  Negative for dizziness and headaches.  Psychiatric/Behavioral:  Negative for dysphoric mood and sleep disturbance. The patient is not nervous/anxious.    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  Current Meds  Medication Sig   Cholecalciferol 25 MCG (1000 UT) tablet Take by mouth.   losartan-hydrochlorothiazide (HYZAAR) 100-25 MG tablet Take 1 tablet by mouth daily.   simvastatin (ZOCOR) 10 MG tablet Take 10 mg by mouth daily.   verapamil (CALAN-SR) 180 MG CR tablet Take 1 tablet (180 mg total) by mouth at bedtime.   [DISCONTINUED] calcium citrate-vitamin  D (CITRACAL+D) 315-200 MG-UNIT tablet Take 1 tablet by mouth 2 (two) times daily.   [DISCONTINUED] Verapamil HCl CR 200 MG CP24     PHQ 2/9 Scores 04/24/2021  PHQ - 2 Score 3  PHQ- 9 Score 5    GAD 7 : Generalized Anxiety Score 04/24/2021  Nervous, Anxious, on Edge 0  Control/stop worrying 0  Worry too much -  different things 0  Trouble relaxing 0  Restless 0  Easily annoyed or irritable 0  Afraid - awful might happen 0  Total GAD 7 Score 0    BP Readings from Last 3 Encounters:  04/24/21 132/72  07/01/20 131/81  06/03/17 119/73    Physical Exam Vitals and nursing note reviewed.  Constitutional:      General: She is not in acute distress.    Appearance: Normal appearance. She is well-developed. She is obese.  HENT:     Head: Normocephalic and atraumatic.  Neck:     Vascular: No carotid bruit.  Cardiovascular:     Rate and Rhythm: Normal rate and regular rhythm.     Pulses: Normal pulses.     Heart sounds: No murmur heard. Pulmonary:     Effort: Pulmonary effort is normal. No respiratory distress.     Breath sounds: No wheezing or rhonchi.  Musculoskeletal:     Cervical back: Normal range of motion.     Right lower leg: No edema.     Left lower leg: No edema.  Lymphadenopathy:     Cervical: No cervical adenopathy.  Skin:    General: Skin is warm and dry.     Findings: No rash.  Neurological:     General: No focal deficit present.     Mental Status: She is alert and oriented to person, place, and time.  Psychiatric:        Mood and Affect: Mood normal.        Behavior: Behavior normal.    Wt Readings from Last 3 Encounters:  04/24/21 229 lb (103.9 kg)  06/03/17 230 lb (104.3 kg)  05/21/15 235 lb (106.6 kg)    BP 132/72   Pulse (!) 55   Temp 98.1 F (36.7 C) (Oral)   Ht 5\' 7"  (1.702 m)   Wt 229 lb (103.9 kg)   SpO2 96%   BMI 35.87 kg/m   Assessment and Plan: 1. Essential hypertension Clinically stable exam with well controlled BP. Tolerating medications without side effects at this time. Pt to continue current regimen and low sodium diet; benefits of regular exercise as able discussed. Will change diltiazem 200 caps to diltiazem 180 tabs with next refill. - CBC with Differential/Platelet - Comprehensive metabolic panel - TSH  2.  Hypercholesterolemia Tolerating statin medication without side effects at this time LDL is at goal of < 70 on current dose Continue same therapy without change at this time. - Lipid panel  3. Hypercalcemia Will check PTH - Parathyroid hormone, intact (no Ca)  4. Need for immunization against influenza - Flu Vaccine QUAD High Dose(Fluad)  5. Primary osteoarthritis of both knees Recommend continuing activity as tolerated Tylenol AS PRN; avoid nsaids  6. Severe obesity (BMI 35.0-39.9) with comorbidity (HCC) Remain active; prepare healthy meals and avoid fast food.   Partially dictated using 07-04-1990. Any errors are unintentional.  Animal nutritionist, MD [facility]  04/24/2021

## 2021-04-25 DIAGNOSIS — I10 Essential (primary) hypertension: Secondary | ICD-10-CM | POA: Diagnosis not present

## 2021-04-25 DIAGNOSIS — E7800 Pure hypercholesterolemia, unspecified: Secondary | ICD-10-CM | POA: Diagnosis not present

## 2021-04-26 ENCOUNTER — Other Ambulatory Visit: Payer: Self-pay

## 2021-04-26 DIAGNOSIS — R7989 Other specified abnormal findings of blood chemistry: Secondary | ICD-10-CM

## 2021-04-26 LAB — COMPREHENSIVE METABOLIC PANEL
ALT: 11 IU/L (ref 0–32)
AST: 13 IU/L (ref 0–40)
Albumin/Globulin Ratio: 1.9 (ref 1.2–2.2)
Albumin: 4.2 g/dL (ref 3.7–4.7)
Alkaline Phosphatase: 77 IU/L (ref 44–121)
BUN/Creatinine Ratio: 21 (ref 12–28)
BUN: 13 mg/dL (ref 8–27)
Bilirubin Total: 0.4 mg/dL (ref 0.0–1.2)
CO2: 20 mmol/L (ref 20–29)
Calcium: 10.8 mg/dL — ABNORMAL HIGH (ref 8.7–10.3)
Chloride: 101 mmol/L (ref 96–106)
Creatinine, Ser: 0.63 mg/dL (ref 0.57–1.00)
Globulin, Total: 2.2 g/dL (ref 1.5–4.5)
Glucose: 86 mg/dL (ref 65–99)
Potassium: 4 mmol/L (ref 3.5–5.2)
Sodium: 139 mmol/L (ref 134–144)
Total Protein: 6.4 g/dL (ref 6.0–8.5)
eGFR: 92 mL/min/{1.73_m2} (ref >59)

## 2021-04-26 LAB — CBC WITH DIFFERENTIAL/PLATELET
Basophils Absolute: 0 10*3/uL (ref 0.0–0.2)
Basos: 1 %
EOS (ABSOLUTE): 0.2 10*3/uL (ref 0.0–0.4)
Eos: 4 %
Hematocrit: 39.6 % (ref 34.0–46.6)
Hemoglobin: 13.1 g/dL (ref 11.1–15.9)
Immature Grans (Abs): 0 10*3/uL (ref 0.0–0.1)
Immature Granulocytes: 0 %
Lymphocytes Absolute: 1.4 10*3/uL (ref 0.7–3.1)
Lymphs: 29 %
MCH: 29.9 pg (ref 26.6–33.0)
MCHC: 33.1 g/dL (ref 31.5–35.7)
MCV: 90 fL (ref 79–97)
Monocytes Absolute: 0.5 10*3/uL (ref 0.1–0.9)
Monocytes: 11 %
Neutrophils Absolute: 2.6 10*3/uL (ref 1.4–7.0)
Neutrophils: 55 %
Platelets: 308 10*3/uL (ref 150–450)
RBC: 4.38 x10E6/uL (ref 3.77–5.28)
RDW: 13.5 % (ref 11.7–15.4)
WBC: 4.7 10*3/uL (ref 3.4–10.8)

## 2021-04-26 LAB — LIPID PANEL
Chol/HDL Ratio: 2.2 ratio (ref 0.0–4.4)
Cholesterol, Total: 173 mg/dL (ref 100–199)
HDL: 78 mg/dL (ref >39)
LDL Chol Calc (NIH): 77 mg/dL (ref 0–99)
Triglycerides: 101 mg/dL (ref 0–149)
VLDL Cholesterol Cal: 18 mg/dL (ref 5–40)

## 2021-04-26 LAB — PARATHYROID HORMONE, INTACT (NO CA): PTH: 31 pg/mL (ref 15–65)

## 2021-04-26 LAB — TSH: TSH: 1.54 u[IU]/mL (ref 0.450–4.500)

## 2021-07-02 ENCOUNTER — Telehealth: Payer: Self-pay | Admitting: Internal Medicine

## 2021-07-02 NOTE — Telephone Encounter (Signed)
Copied from CRM [id number]. Topic: Quick Communication - Rx Refill/Question >> Jul 02, 2021  9:58 AM Jaquita Rector A wrote: Medication: verapamil (CALAN-SR) 180 MG CR tablet  Rx from September not received  Has the patient contacted their pharmacy? No. Rx since September  (Agent: If no, request that the patient contact the pharmacy for the refill. If patient does not wish to contact the pharmacy document the reason why and proceed with request.) (Agent: If yes, when and what did the pharmacy advise?)  Preferred Pharmacy (with phone number or street name): [id number] - [place], [place] - [address] [place] [address] AT [facility] [address] [facility] [address]  Phone:  [phone] Fax:  [phone]    Has the patient been seen for an appointment in the last year OR does the patient have an upcoming appointment? Yes.    Agent: Please be advised that RX refills may take up to 3 business days. We ask that you follow-up with your pharmacy.

## 2021-07-03 NOTE — Telephone Encounter (Signed)
Requested medications are due for refill today.  A little soon  Requested medications are on the active medications list.  yes  Last refill. 04/24/2021  Future visit scheduled.   yes  Notes to clinic.  PCP is listed as Dr. Harrington Challenger

## 2021-07-18 ENCOUNTER — Other Ambulatory Visit: Payer: Self-pay

## 2021-07-18 MED ORDER — VERAPAMIL HCL ER 180 MG PO TBCR: 180.0000 mg | EXTENDED_RELEASE_TABLET | Freq: Every day | ORAL | 0 refills | Status: DC

## 2021-07-18 NOTE — Telephone Encounter (Signed)
Refill sent to mail order pharmacy.  KP

## 2021-07-18 NOTE — Telephone Encounter (Signed)
Pt states the verapamil (CALAN-SR) 180 MG CR tablet was NEVER received by the pharmacy on 04/24/21. Pt wanted the tablet.  The Rx says "print". 90 day supply Can you resend electronically to   [facility] - [place], [place] - [address]  Pt is new with Dr Asencion Partridge, and has not had anything filled by her yet.

## 2021-08-20 ENCOUNTER — Telehealth: Payer: Self-pay

## 2021-08-20 ENCOUNTER — Other Ambulatory Visit: Payer: Self-pay

## 2021-08-20 DIAGNOSIS — R7989 Other specified abnormal findings of blood chemistry: Secondary | ICD-10-CM

## 2021-08-20 NOTE — Telephone Encounter (Signed)
Copied from CRM [id number]. Topic: Referral - Status >> Aug 20, 2021  9:44 AM Marylen Ponto wrote: Reason for CRM: Pt stated the specialist that she was referred to is no longer taking new patients.

## 2021-08-26 ENCOUNTER — Encounter: Payer: Self-pay | Admitting: Internal Medicine

## 2021-08-26 ENCOUNTER — Ambulatory Visit (INDEPENDENT_AMBULATORY_CARE_PROVIDER_SITE_OTHER): Payer: Medicare HMO | Admitting: Internal Medicine

## 2021-08-26 ENCOUNTER — Other Ambulatory Visit: Payer: Self-pay

## 2021-08-26 VITALS — BP 102/72 | HR 84 | Ht 67.0 in | Wt 225.0 lb

## 2021-08-26 DIAGNOSIS — M8580 Other specified disorders of bone density and structure, unspecified site: Secondary | ICD-10-CM

## 2021-08-26 DIAGNOSIS — I10 Essential (primary) hypertension: Secondary | ICD-10-CM

## 2021-08-26 DIAGNOSIS — E213 Hyperparathyroidism, unspecified: Secondary | ICD-10-CM | POA: Diagnosis not present

## 2021-08-26 NOTE — Progress Notes (Signed)
Date:  08/26/2021   Name:  Elizabeth Franco   DOB:  [birth year]   MRN:  [id number]   Chief Complaint: Hypertension  Hypertension This is a chronic problem. The problem is controlled. Pertinent negatives include no chest pain, headaches or shortness of breath. Past treatments include diuretics, calcium channel blockers and angiotensin blockers. The current treatment provides significant improvement.  Primary hyperparathyroidism - calcium was elevated on labs in September and PTH was normal, suggesting primary HyperPTH.  Pt was referred to Endocrinology but she has transportation issues.  Lab Results  Component Value Date   NA 139 04/25/2021   K 4.0 04/25/2021   CO2 20 04/25/2021   GLUCOSE 86 04/25/2021   BUN 13 04/25/2021   CREATININE 0.63 04/25/2021   CALCIUM 10.8 (H) 04/25/2021   EGFR 92 04/25/2021   Lab Results  Component Value Date   CHOL 173 04/25/2021   HDL 78 04/25/2021   LDLCALC 77 04/25/2021   TRIG 101 04/25/2021   CHOLHDL 2.2 04/25/2021   Lab Results  Component Value Date   TSH 1.540 04/25/2021   No results found for: HGBA1C Lab Results  Component Value Date   WBC 4.7 04/25/2021   HGB 13.1 04/25/2021   HCT 39.6 04/25/2021   MCV 90 04/25/2021   PLT 308 04/25/2021   Lab Results  Component Value Date   ALT 11 04/25/2021   AST 13 04/25/2021   ALKPHOS 77 04/25/2021   BILITOT 0.4 04/25/2021   No results found for: 25OHVITD2, 25OHVITD3, VD25OH   Review of Systems  Constitutional:  Negative for chills, fatigue and fever.  Eyes:  Negative for visual disturbance.  Respiratory:  Negative for cough, chest tightness and shortness of breath.   Cardiovascular:  Negative for chest pain.  Musculoskeletal:  Positive for arthralgias.  Neurological:  Negative for dizziness and headaches.  Psychiatric/Behavioral:  Negative for dysphoric mood and sleep disturbance. The patient is not nervous/anxious.    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  Current Meds  Medication Sig   Ascorbic Acid (VITAMIN C) 1000 MG tablet Take 1,000 mg by mouth daily.   losartan-hydrochlorothiazide (HYZAAR) 100-25 MG tablet Take 1 tablet by mouth daily.   Omega-3 Fatty Acids (FISH OIL) 1000 MG CAPS Take by mouth.   simvastatin (ZOCOR) 10 MG tablet Take 10 mg by mouth daily.   verapamil (CALAN-SR) 180 MG CR tablet Take 1 tablet (180 mg total) by mouth at bedtime.   vitamin B-12 (CYANOCOBALAMIN) 500 MCG tablet Take 500 mcg by mouth daily.    PHQ 2/9 Scores 08/26/2021 04/24/2021  PHQ - 2 Score 0 3  PHQ- 9 Score 0 5    GAD 7 : Generalized Anxiety Score 08/26/2021  04/24/2021  Nervous, Anxious, on Edge 0 0  Control/stop worrying 0 0  Worry too much - different things 0 0  Trouble relaxing 0 0  Restless 0 0  Easily annoyed or irritable 0 0  Afraid - awful might happen 0 0  Total GAD 7 Score 0 0  Anxiety Difficulty Not difficult at all -    BP Readings from Last 3 Encounters:  08/26/21 102/72  04/24/21 132/72  07/01/20 131/81    Physical Exam Vitals and nursing note reviewed.  Constitutional:      General: She is not in acute distress.    Appearance: Normal appearance. She is well-developed.  HENT:     Head: Normocephalic and atraumatic.   Cardiovascular:     Rate and Rhythm: Normal rate and regular rhythm.  Pulmonary:     Effort: Pulmonary effort is normal. No respiratory distress.  Musculoskeletal:     Cervical back: Normal range of motion.     Right lower leg: No edema.     Left lower leg: No edema.  Lymphadenopathy:     Cervical: No cervical adenopathy.  Skin:    General: Skin is warm and dry.     Capillary Refill: Capillary refill takes less than 2 seconds.     Findings: No rash.  Neurological:     General: No focal deficit present.     Mental Status: She is alert and oriented to person, place, and time.  Psychiatric:        Mood and Affect: Mood normal.        Behavior: Behavior normal.    Wt Readings from Last 3 Encounters:  08/26/21 225 lb (102.1 kg)  04/24/21 229 lb (103.9 kg)  06/03/17 230 lb (104.3 kg)    BP 102/72    Pulse 84    Ht _0  (1.702 m)    Wt 225 lb (102.1 kg)    SpO2 97%    BMI 35.24 kg/m   Assessment and Plan: 1. Essential hypertension Clinically stable exam with well controlled BP. Tolerating medications without side effects at this time. Pt to continue current regimen and low sodium diet; benefits of regular exercise as able discussed. - CBC with Differential/Platelet  2. Hyperparathyroidism [facility]) She is unable to drive to [facility] or [facility] for Endo consultation. She is asymptomatic so will continue to monitor labs and exam for now. - Comprehensive metabolic panel - VITAMIN D 25 Hydroxy (Vit-D Deficiency, Fractures) - Parathyroid hormone, intact (no Ca) - Phosphorus  3. Osteopenia determined by x-ray - DG Bone Density  4. Severe obesity (BMI 35.0-39.9) with comorbidity ([place]) Encourage regular exercise such as walking 10-15 minutes per day if able. Cut back on portions at meals and limit snacks   Partially dictated using Editor, commissioning. Any errors are unintentional.  Halina Maidens, MD [facility]  08/26/2021

## 2021-08-26 NOTE — Progress Notes (Signed)
° ° °  Date:  08/26/2021   Name:  Elizabeth Franco   DOB:  11-Feb-1946   MRN:  [id number]   Chief Complaint: Hypertension  HPI  Lab Results  Component Value Date   NA 139 04/25/2021   K 4.0 04/25/2021   CO2 20 04/25/2021   GLUCOSE 86 04/25/2021   BUN 13 04/25/2021   CREATININE 0.63 04/25/2021   CALCIUM 10.8 (H) 04/25/2021   EGFR 92 04/25/2021   Lab Results  Component Value Date   CHOL 173 04/25/2021   HDL 78 04/25/2021   LDLCALC 77 04/25/2021   TRIG 101 04/25/2021   CHOLHDL 2.2 04/25/2021   Lab Results  Component Value Date   TSH 1.540 04/25/2021   No results found for: HGBA1C Lab Results  Component Value Date   WBC 4.7 04/25/2021   HGB 13.1 04/25/2021   HCT 39.6 04/25/2021   MCV 90 04/25/2021   PLT 308 04/25/2021   Lab Results  Component Value Date   ALT 11 04/25/2021   AST 13 04/25/2021   ALKPHOS 77 04/25/2021   BILITOT 0.4 04/25/2021   No results found for: 25OHVITD2, 25OHVITD3, VD25OH   Review of Systems  Patient Active Problem List   Diagnosis Date Noted   Essential hypertension 04/19/2021   Osteopenia of multiple sites 02/11/2016   Severe obesity (BMI 35.0-39.9) with comorbidity ([place]) 07/12/2015   Osteoarthritis of both knees 05/07/2014   Hypercholesterolemia 05/07/2014   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  No outpatient medications have been marked as taking for the 08/26/21 encounter (Office Visit) with [name], MD.    [facility] 2/9 Scores 04/24/2021  PHQ - 2 Score 3  PHQ- 9 Score 5    GAD 7 : Generalized Anxiety Score 04/24/2021   Nervous, Anxious, on Edge 0  Control/stop worrying 0  Worry too much - different things 0  Trouble relaxing 0  Restless 0  Easily annoyed or irritable 0  Afraid - awful might happen 0  Total GAD 7 Score 0    BP Readings from Last 3 Encounters:  04/24/21 132/72  07/01/20 131/81  06/03/17 119/73    Physical Exam  Wt Readings from Last 3 Encounters:  04/24/21 229 lb (103.9 kg)  06/03/17 230 lb (104.3 kg)  05/21/15 235 lb (106.6 kg)    Ht $R'5\' 7"'Ig$  (1.702 m)    BMI 35.87 kg/m   Assessment and Plan:

## 2021-08-27 LAB — COMPREHENSIVE METABOLIC PANEL
ALT: 17 IU/L (ref 0–32)
AST: 15 IU/L (ref 0–40)
Albumin/Globulin Ratio: 1.7 (ref 1.2–2.2)
Albumin: 4.5 g/dL (ref 3.7–4.7)
Alkaline Phosphatase: 87 IU/L (ref 44–121)
BUN/Creatinine Ratio: 16 (ref 12–28)
BUN: 12 mg/dL (ref 8–27)
Bilirubin Total: 0.4 mg/dL (ref 0.0–1.2)
CO2: 23 mmol/L (ref 20–29)
Calcium: 10.9 mg/dL — ABNORMAL HIGH (ref 8.7–10.3)
Chloride: 104 mmol/L (ref 96–106)
Creatinine, Ser: 0.74 mg/dL (ref 0.57–1.00)
Globulin, Total: 2.6 g/dL (ref 1.5–4.5)
Glucose: 90 mg/dL (ref 70–99)
Potassium: 4.2 mmol/L (ref 3.5–5.2)
Sodium: 140 mmol/L (ref 134–144)
Total Protein: 7.1 g/dL (ref 6.0–8.5)
eGFR: 84 mL/min/{1.73_m2} (ref >59)

## 2021-08-27 LAB — CBC WITH DIFFERENTIAL/PLATELET
Basophils Absolute: 0.1 10*3/uL (ref 0.0–0.2)
Basos: 1 %
EOS (ABSOLUTE): 0.2 10*3/uL (ref 0.0–0.4)
Eos: 3 %
Hematocrit: 40.9 % (ref 34.0–46.6)
Hemoglobin: 13.7 g/dL (ref 11.1–15.9)
Immature Grans (Abs): 0 10*3/uL (ref 0.0–0.1)
Immature Granulocytes: 0 %
Lymphocytes Absolute: 1.8 10*3/uL (ref 0.7–3.1)
Lymphs: 38 %
MCH: 30.2 pg (ref 26.6–33.0)
MCHC: 33.5 g/dL (ref 31.5–35.7)
MCV: 90 fL (ref 79–97)
Monocytes Absolute: 0.6 10*3/uL (ref 0.1–0.9)
Monocytes: 12 %
Neutrophils Absolute: 2.2 10*3/uL (ref 1.4–7.0)
Neutrophils: 46 %
Platelets: 307 10*3/uL (ref 150–450)
RBC: 4.54 x10E6/uL (ref 3.77–5.28)
RDW: 13.1 % (ref 11.7–15.4)
WBC: 4.8 10*3/uL (ref 3.4–10.8)

## 2021-08-27 LAB — VITAMIN D 25 HYDROXY (VIT D DEFICIENCY, FRACTURES): Vit D, 25-Hydroxy: 28.4 ng/mL — ABNORMAL LOW (ref 30.0–100.0)

## 2021-08-27 LAB — PHOSPHORUS: Phosphorus: 2.8 mg/dL — ABNORMAL LOW (ref 3.0–4.3)

## 2021-08-27 LAB — PARATHYROID HORMONE, INTACT (NO CA): PTH: 41 pg/mL (ref 15–65)

## 2021-09-09 ENCOUNTER — Ambulatory Visit (INDEPENDENT_AMBULATORY_CARE_PROVIDER_SITE_OTHER): Payer: Medicare HMO

## 2021-09-09 ENCOUNTER — Other Ambulatory Visit: Payer: Self-pay

## 2021-09-09 VITALS — BP 112/78 | HR 68 | Temp 98.5°F | Resp 16 | Ht 67.0 in | Wt 227.4 lb

## 2021-09-09 DIAGNOSIS — Z0000 Encounter for general adult medical examination without abnormal findings: Secondary | ICD-10-CM | POA: Diagnosis not present

## 2021-09-09 DIAGNOSIS — Z0100 Encounter for examination of eyes and vision without abnormal findings: Secondary | ICD-10-CM | POA: Diagnosis not present

## 2021-09-09 NOTE — Progress Notes (Signed)
Subjective:   Elizabeth Franco is a 76 y.o. female who presents for Medicare Annual (Subsequent) preventive examination.  Review of Systems     Cardiac Risk Factors include: advanced age (>47men, >18 women);dyslipidemia;hypertension;obesity (BMI >30kg/m2)     Objective:    Today's Vitals   09/09/21 1054  BP: 112/78  Pulse: 68  Resp: 16  Temp: 98.5 F (36.9 C)  TempSrc: Oral  SpO2: 98%  Weight: 227 lb 6.4 oz (103.1 kg)  Height: 5\' 7"  (1.702 m)   Body mass index is 35.62 kg/m.  Advanced Directives 09/09/2021 06/03/2017  Does Patient Have a Medical Advance Directive? No No  Would patient like information on creating a medical advance directive? Yes (MAU/Ambulatory/Procedural Areas - Information given) -    Current Medications (verified) Outpatient Encounter Medications as of 09/09/2021  Medication Sig   Ascorbic Acid (VITAMIN C) 1000 MG tablet Take 1,000 mg by mouth daily.   cholecalciferol (VITAMIN D3) 25 MCG (1000 UNIT) tablet Take 1,000 Units by mouth daily.   losartan-hydrochlorothiazide (HYZAAR) 100-25 MG tablet Take 1 tablet by mouth daily.   Omega-3 Fatty Acids (FISH OIL) 1000 MG CAPS Take by mouth.   simvastatin (ZOCOR) 10 MG tablet Take 10 mg by mouth daily.   verapamil (CALAN-SR) 180 MG CR tablet Take 1 tablet (180 mg total) by mouth at bedtime.   vitamin B-12 (CYANOCOBALAMIN) 500 MCG tablet Take 500 mcg by mouth daily.   No facility-administered encounter medications on file as of 09/09/2021.    Allergies (verified) Patient has no known allergies.   History: Past Medical History:  Diagnosis Date   Hyperlipemia    Hypertension    Past Surgical History:  Procedure Laterality Date   ABDOMINAL HYSTERECTOMY     BREAST BIOPSY Left    neg   Family History  Problem Relation Age of Onset   Hypertension Mother    Breast cancer Mother 38   Breast cancer Maternal Aunt    Diabetes Maternal Grandmother    Breast cancer Maternal Grandmother 20   Social  History   Socioeconomic History   Marital status: Widowed    Spouse name: Not on file   Number of children: 5   Years of education: Not on file   Highest education level: Not on file  Occupational History   Not on file  Tobacco Use   Smoking status: Never   Smokeless tobacco: Never  Vaping Use   Vaping Use: Never used  Substance and Sexual Activity   Alcohol use: No   Drug use: Not Currently   Sexual activity: Not Currently  Other Topics Concern   Not on file  Social History Narrative   3 living children   Pt lives alone; grandson stays sometimes   Social Determinants of Health   Financial Resource Strain: Low Risk    Difficulty of Paying Living Expenses: Not very hard  Food Insecurity: No Food Insecurity   Worried About Programme researcher, broadcasting/film/video in the Last Year: Never true   Ran Out of Food in the Last Year: Never true  Transportation Needs: No Transportation Needs   Lack of Transportation (Medical): No   Lack of Transportation (Non-Medical): No  Physical Activity: Inactive   Days of Exercise per Week: 0 days   Minutes of Exercise per Session: 0 min  Stress: No Stress Concern Present   Feeling of Stress : Not at all  Social Connections: Moderately Isolated   Frequency of Communication with Friends and Family: More than  three times a week   Frequency of Social Gatherings with Friends and Family: Three times a week   Attends Religious Services: More than 4 times per year   Active Member of Clubs or Organizations: No   Attends Archivist Meetings: Never   Marital Status: Widowed    Tobacco Counseling Counseling given: Not Answered   Clinical Intake:  Pre-visit preparation completed: Yes  Pain : No/denies pain     BMI - recorded: 35.62 Nutritional Status: BMI > 30  Obese Nutritional Risks: None Diabetes: No  How often do you need to have someone help you when you read instructions, pamphlets, or other written materials from your doctor or pharmacy?:  1 - Never    Interpreter Needed?: No  Information entered by :: Clemetine Marker LPN   Activities of Daily Living In your present state of health, do you have any difficulty performing the following activities: 09/09/2021 08/26/2021  Hearing? N N  Vision? Y Y  Difficulty concentrating or making decisions? N Y  Walking or climbing stairs? N Y  Dressing or bathing? N Y  Doing errands, shopping? N Y  Conservation officer, nature and eating ? N -  Using the Toilet? N -  In the past six months, have you accidently leaked urine? N -  Do you have problems with loss of bowel control? N -  Managing your Medications? N -  Managing your Finances? N -  Housekeeping or managing your Housekeeping? N -  Some recent data might be hidden    Patient Care Team: [name], MD as PCP - General (Internal Medicine)  Indicate any recent Medical Services you may have received from other than [facility] providers in the past year (date may be approximate).     Assessment:   This is a routine wellness examination for Quisha.  Hearing/Vision screen Hearing Screening - Comments:: Pt denies hearing difficulty Vision Screening - Comments:: Past due for eye exam; referral sent to [facility] today   Dietary issues and exercise activities discussed: Current Exercise Habits: The patient does not participate in regular exercise at present, Exercise limited by: None identified   Goals Addressed             This Visit's Progress    Increase physical activity       Pt would like to increase physical activity to at least 3 days per week        Depression Screen PHQ 2/9 Scores 09/09/2021 08/26/2021 04/24/2021  PHQ - 2 Score 0 0 3  PHQ- 9 Score 0 0 5    Fall Risk Fall Risk  09/09/2021 08/26/2021 04/24/2021  Falls in the past year? 0 0 1  Number falls in past yr: 0 0 0  Injury with Fall? 0 0 0  Risk for fall due to : No Fall Risks No Fall Risks No Fall Risks  Follow up Falls prevention discussed Falls evaluation  completed Falls evaluation completed    [place]:  Any stairs in or around the home? Yes  If so, are there any without handrails? No  Home free of loose throw rugs in walkways, pet beds, electrical cords, etc? Yes  Adequate lighting in your home to reduce risk of falls? Yes   ASSISTIVE DEVICES UTILIZED TO PREVENT FALLS:  Life alert? No  Use of a cane, walker or w/c? No  Grab bars in the bathroom? No  Shower chair or bench in shower? No  Elevated toilet seat or a handicapped toilet? Yes   TIMED UP AND GO:  Was the test performed? Yes .  Length of time to ambulate 10 feet: 5 sec.   Gait steady and fast without use of assistive device  Cognitive Function:     6CIT Screen 09/09/2021  What Year? 0 points  What month? 0 points  What time? 0 points  Count back from 20 0 points  Months in reverse 2 points  Repeat phrase 4 points  Total Score 6    Immunizations Immunization History  Administered Date(s) Administered   Fluad Quad(high Dose 65+) 05/03/2019, 04/24/2021   Influenza, High Dose Seasonal PF 05/04/2018   Influenza-Unspecified 06/16/2015, 06/04/2016, 05/11/2020   Moderna Sars-Covid-2 Vaccination 08/21/2019, 09/18/2019, 06/06/2020, 01/30/2021   Pneumococcal Conjugate-13 08/01/2016   Pneumococcal Polysaccharide-23 11/09/2012   Tdap 01/09/2015    TDAP status: Up to date  Flu Vaccine status: Up to date  Pneumococcal vaccine status: Up to date  Covid-19 vaccine status: Completed vaccines  Qualifies for Shingles Vaccine? Yes   Zostavax completed No   Shingrix Completed?: No.    Education has been provided regarding the importance of this vaccine. Patient has been advised to call insurance company to determine out of pocket expense if they have not yet received this vaccine. Advised may also receive vaccine at local pharmacy or Health Dept. Verbalized acceptance and understanding.  Screening Tests Health Maintenance  Topic Date  Due   Zoster Vaccines- Shingrix (1 of 2) Never done   COVID-19 Vaccine (5 - Booster for Moderna series) 03/27/2021   MAMMOGRAM  11/29/2021   TETANUS/TDAP  01/08/2025   Pneumonia Vaccine 96+ Years old  Completed   INFLUENZA VACCINE  Completed   DEXA SCAN  Completed   Hepatitis C Screening  Completed   HPV VACCINES  Aged Out   COLONOSCOPY (Pts 45-43yrs Insurance coverage will need to be confirmed)  Discontinued    Health Maintenance  Health Maintenance Due  Topic Date Due   Zoster Vaccines- Shingrix (1 of 2) Never done   COVID-19 Vaccine (5 - Booster for Moderna series) 03/27/2021    Colorectal cancer screening: No longer required.   Mammogram status: Completed 11/29/20. Repeat every year  Bone Density status: Completed 08/12/19. Results reflect: Bone density results: OSTEOPENIA. Repeat every 2 years. Ordered 08/26/21  Lung Cancer Screening: (Low Dose CT Chest recommended if Age 70-80 years, 30 pack-year currently smoking OR have quit w/in 15years.) does not qualify.   Additional Screening:  Hepatitis C Screening: does qualify; Completed 08/12/19  Vision Screening: Recommended annual ophthalmology exams for early detection of glaucoma and other disorders of the eye. Is the patient up to date with their annual eye exam?  No  Who is the provider or what is the name of the office in which the patient attends annual eye exams? Not established If pt is not established with a provider, would they like to be referred to a provider to establish care? Yes .   Dental Screening: Recommended annual dental exams for proper oral hygiene  Community Resource Referral / Chronic Care Management: CRR required this visit?  No   CCM required this visit?  No      Plan:     I have personally reviewed and noted the following in the patients chart:   Medical and social history Use of alcohol, tobacco or illicit drugs  Current medications and supplements including opioid prescriptions.   Functional ability and status Nutritional status Physical activity Advanced directives  List of other physicians Hospitalizations, surgeries, and ER visits in previous 12 months Vitals Screenings to include cognitive, depression, and falls Referrals and appointments  In addition, I have reviewed and discussed with patient certain preventive protocols, quality metrics, and best practice recommendations. A written personalized care plan for preventive services as well as general preventive health recommendations were provided to patient.     Clemetine Marker, LPN   [id number]   Nurse Notes: none

## 2021-09-09 NOTE — Patient Instructions (Signed)
[name] , Thank you for taking time to come for your Medicare Wellness Visit. I appreciate your ongoing commitment to your health goals. Please review the following plan we discussed and let me know if I can assist you in the future.   Screening recommendations/referrals: Colonoscopy: no longer required Mammogram: done 11/29/20 Bone Density: done 08/12/19. Please call [phone] to schedule your bone density screening.  Recommended yearly ophthalmology/optometry visit for glaucoma screening and checkup. Referral sent to [facility] today. They will contact you for an appointment.  Recommended yearly dental visit for hygiene and checkup  Vaccinations: Influenza vaccine: done 04/24/21 Pneumococcal vaccine: done 08/01/16 Tdap vaccine: done 01/09/15 Shingles vaccine: Shingrix discussed. Please contact your pharmacy for coverage information.  Covid-19:done 08/21/19, 09/18/19, 06/06/20 & 01/30/21  Advanced directives: Advance directive discussed with you today. I have provided a copy for you to complete at home and have notarized. Once this is complete please bring a copy in to our office so we can scan it into your chart.   Conditions/risks identified: Recommend increasing physical activity to at least 3 days per week   Next appointment: Follow up in one year for your annual wellness visit    Preventive Care 65 Years and Older, Female Preventive care refers to lifestyle choices and visits with your health care provider that can promote health and wellness. What does preventive care include? A yearly physical exam. This is also called an annual well check. Dental exams once or twice a year. Routine eye exams. Ask your health care provider how often you should have your eyes checked. Personal lifestyle choices, including: Daily care of your teeth and gums. Regular physical activity. Eating a healthy diet. Avoiding tobacco and drug use. Limiting alcohol use. Practicing safe  sex. Taking low-dose aspirin every day. Taking vitamin and mineral supplements as recommended by your health care provider. What happens during an annual well check? The services and screenings done by your health care provider during your annual well check will depend on your age, overall health, lifestyle risk factors, and family history of disease. Counseling  Your health care provider may ask you questions about your: Alcohol use. Tobacco use. Drug use. Emotional well-being. Home and relationship well-being. Sexual activity. Eating habits. History of falls. Memory and ability to understand (cognition). Work and work Astronomer. Reproductive health. Screening  You may have the following tests or measurements: Height, weight, and BMI. Blood pressure. Lipid and cholesterol levels. These may be checked every 5 years, or more frequently if you are over 74 years old. Skin check. Lung cancer screening. You may have this screening every year starting at age 13 if you have a 30-pack-year history of smoking and currently smoke or have quit within the past 15 years. Fecal occult blood test (FOBT) of the stool. You may have this test every year starting at age 86. Flexible sigmoidoscopy or colonoscopy. You may have a sigmoidoscopy every 5 years or a colonoscopy every 10 years starting at age 30. Hepatitis C blood test. Hepatitis B blood test. Sexually transmitted disease (STD) testing. Diabetes screening. This is done by checking your blood sugar (glucose) after you have not eaten for a while (fasting). You may have this done every 1-3 years. Bone density scan. This is done to screen for osteoporosis. You may have this done starting at age 25. Mammogram. This may be done every 1-2 years. Talk to your health care provider about how often you should have regular mammograms. Talk with your health care  provider about your test results, treatment options, and if necessary, the need for more  tests. Vaccines  Your health care provider may recommend certain vaccines, such as: Influenza vaccine. This is recommended every year. Tetanus, diphtheria, and acellular pertussis (Tdap, Td) vaccine. You may need a Td booster every 10 years. Zoster vaccine. You may need this after age 62. Pneumococcal 13-valent conjugate (PCV13) vaccine. One dose is recommended after age 48. Pneumococcal polysaccharide (PPSV23) vaccine. One dose is recommended after age 66. Talk to your health care provider about which screenings and vaccines you need and how often you need them. This information is not intended to replace advice given to you by your health care provider. Make sure you discuss any questions you have with your health care provider. Document Released: 08/17/2015 Document Revised: 04/09/2016 Document Reviewed: 05/22/2015 Elsevier Interactive Patient Education  2017 [employer].  Fall Prevention in the Home Falls can cause injuries. They can happen to people of all ages. There are many things you can do to make your home safe and to help prevent falls. What can I do on the outside of my home? Regularly fix the edges of walkways and driveways and fix any cracks. Remove anything that might make you trip as you walk through a door, such as a raised step or threshold. Trim any bushes or trees on the path to your home. Use bright outdoor lighting. Clear any walking paths of anything that might make someone trip, such as rocks or tools. Regularly check to see if handrails are loose or broken. Make sure that both sides of any steps have handrails. Any raised decks and porches should have guardrails on the edges. Have any leaves, snow, or ice cleared regularly. Use sand or salt on walking paths during winter. Clean up any spills in your garage right away. This includes oil or grease spills. What can I do in the bathroom? Use night lights. Install grab bars by the toilet and in the tub and shower.  Do not use towel bars as grab bars. Use non-skid mats or decals in the tub or shower. If you need to sit down in the shower, use a plastic, non-slip stool. Keep the floor dry. Clean up any water that spills on the floor as soon as it happens. Remove soap buildup in the tub or shower regularly. Attach bath mats securely with double-sided non-slip rug tape. Do not have throw rugs and other things on the floor that can make you trip. What can I do in the bedroom? Use night lights. Make sure that you have a light by your bed that is easy to reach. Do not use any sheets or blankets that are too big for your bed. They should not hang down onto the floor. Have a firm chair that has side arms. You can use this for support while you get dressed. Do not have throw rugs and other things on the floor that can make you trip. What can I do in the kitchen? Clean up any spills right away. Avoid walking on wet floors. Keep items that you use a lot in easy-to-reach places. If you need to reach something above you, use a strong step stool that has a grab bar. Keep electrical cords out of the way. Do not use floor polish or wax that makes floors slippery. If you must use wax, use non-skid floor wax. Do not have throw rugs and other things on the floor that can make you trip. What can I  do with my stairs? Do not leave any items on the stairs. Make sure that there are handrails on both sides of the stairs and use them. Fix handrails that are broken or loose. Make sure that handrails are as long as the stairways. Check any carpeting to make sure that it is firmly attached to the stairs. Fix any carpet that is loose or worn. Avoid having throw rugs at the top or bottom of the stairs. If you do have throw rugs, attach them to the floor with carpet tape. Make sure that you have a light switch at the top of the stairs and the bottom of the stairs. If you do not have them, ask someone to add them for you. What else  can I do to help prevent falls? Wear shoes that: Do not have high heels. Have rubber bottoms. Are comfortable and fit you well. Are closed at the toe. Do not wear sandals. If you use a stepladder: Make sure that it is fully opened. Do not climb a closed stepladder. Make sure that both sides of the stepladder are locked into place. Ask someone to hold it for you, if possible. Clearly mark and make sure that you can see: Any grab bars or handrails. First and last steps. Where the edge of each step is. Use tools that help you move around (mobility aids) if they are needed. These include: Canes. Walkers. Scooters. Crutches. Turn on the lights when you go into a dark area. Replace any light bulbs as soon as they burn out. Set up your furniture so you have a clear path. Avoid moving your furniture around. If any of your floors are uneven, fix them. If there are any pets around you, be aware of where they are. Review your medicines with your doctor. Some medicines can make you feel dizzy. This can increase your chance of falling. Ask your doctor what other things that you can do to help prevent falls. This information is not intended to replace advice given to you by your health care provider. Make sure you discuss any questions you have with your health care provider. Document Released: 05/17/2009 Document Revised: 12/27/2015 Document Reviewed: 08/25/2014 Elsevier Interactive Patient Education  2017 [employer].

## 2021-10-02 ENCOUNTER — Other Ambulatory Visit: Payer: Self-pay | Admitting: Internal Medicine

## 2021-10-02 MED ORDER — VERAPAMIL HCL ER 180 MG PO TBCR: 180.0000 mg | EXTENDED_RELEASE_TABLET | Freq: Every day | ORAL | 0 refills | Status: DC

## 2021-10-02 MED ORDER — LOSARTAN POTASSIUM-HCTZ 100-25 MG PO TABS
1.0000 | ORAL_TABLET | Freq: Every day | ORAL | 0 refills | Status: DC
Start: 2021-10-02 — End: 2022-03-12

## 2021-10-02 NOTE — Telephone Encounter (Signed)
Please review. Pt stated she has been taken both losartan- HCTZ and verapamil. Does she need to continue to take both?  ? ?KP

## 2021-10-02 NOTE — Telephone Encounter (Signed)
Copied from CRM [id number]. Topic: Quick Communication - Rx Refill/Question ?>> Oct 02, 2021 10:10 AM Gaetana Michaelis A wrote: ?Medication: losartan-hydrochlorothiazide (HYZAAR) 100-25 MG tablet [[id number]]  ? ?verapamil (CALAN-SR) 180 MG CR tablet [[id number]]  ? ?Has the patient contacted their pharmacy? Yes.  The patient's been directed to contact their PCP ?(Agent: If no, request that the patient contact the pharmacy for the refill. If patient does not wish to contact the pharmacy document the reason why and proceed with request.) ?(Agent: If yes, when and what did the pharmacy advise?) ? ?Preferred Pharmacy (with phone number or street name): [facility] - [place], [place] - [address] ?[address] [place] [address] ?Phone: [phone] Fax: [phone] ?Hours: Not open 24 hours ? ? ?Has the patient been seen for an appointment in the last year OR does the patient have an upcoming appointment? Yes.   ? ?Agent: Please be advised that RX refills may take up to 3 business days. We ask that you follow-up with your pharmacy. ?

## 2021-10-02 NOTE — Telephone Encounter (Signed)
Requested medication (s) are due for refill today: historical medication ? ?Requested medication (s) are on the active medication list: yes  ? ?Last refill:  na ? ?Future visit scheduled: yes in 2 months ? ?Notes to clinic:  historical medication. Do you want to order Rx? ? ? ?  ?Requested Prescriptions  ?Pending Prescriptions Disp Refills  ? losartan-hydrochlorothiazide (HYZAAR) 100-25 MG tablet    ?  Sig: Take 1 tablet by mouth daily.  ?  ? Cardiovascular: ARB + Diuretic Combos Passed - 10/02/2021 12:36 PM  ?  ?  Passed - K in normal range and within 180 days  ?  Potassium  ?Date Value Ref Range Status  ?08/26/2021 4.2 3.5 - 5.2 mmol/L Final  ?  ?  ?  ?  Passed - Na in normal range and within 180 days  ?  Sodium  ?Date Value Ref Range Status  ?08/26/2021 140 134 - 144 mmol/L Final  ?  ?  ?  ?  Passed - Cr in normal range and within 180 days  ?  Creatinine, Ser  ?Date Value Ref Range Status  ?08/26/2021 0.74 0.57 - 1.00 mg/dL Final  ?  ?  ?  ?  Passed - eGFR is 10 or above and within 180 days  ?  GFR calc Af Amer  ?Date Value Ref Range Status  ?08/15/2020 146  Final  ? ?eGFR  ?Date Value Ref Range Status  ?08/26/2021 84 >59 mL/min/1.73 Final  ?  ?  ?  ?  Passed - Patient is not pregnant  ?  ?  Passed - Last BP in normal range  ?  BP Readings from Last 1 Encounters:  ?09/09/21 112/78  ?  ?  ?  ?  Passed - Valid encounter within last 6 months  ?  Recent Outpatient Visits   ? ?      ? 1 month ago Essential hypertension  ? [facility] [name], MD  ? 5 months ago Essential hypertension  ? [facility] [name], MD  ? ?  ?  ?Future Appointments   ? ?        ? In 2 months [name], MD [facility], [place]  ? ?  ? ?  ?  ?  ?Signed Prescriptions Disp Refills  ? verapamil (CALAN-SR) 180 MG CR tablet 90 tablet 0  ?  Sig: Take 1 tablet (180 mg total) by mouth at bedtime.  ?  ? Cardiovascular: Calcium Channel Blockers 3 Passed - 10/02/2021 12:36 PM  ?  ?  Passed - ALT in normal  range and within 360 days  ?  ALT  ?Date Value Ref Range Status  ?08/26/2021 17 0 - 32 IU/L Final  ?  ?  ?  ?  Passed - AST in normal range and within 360 days  ?  AST  ?Date Value Ref Range Status  ?08/26/2021 15 0 - 40 IU/L Final  ?  ?  ?  ?  Passed - Cr in normal range and within 360 days  ?  Creatinine, Ser  ?Date Value Ref Range Status  ?08/26/2021 0.74 0.57 - 1.00 mg/dL Final  ?  ?  ?  ?  Passed - Last BP in normal range  ?  BP Readings from Last 1 Encounters:  ?09/09/21 112/78  ?  ?  ?  ?  Passed - Last Heart Rate in normal range  ?  Pulse Readings from  Last 1 Encounters:  ?09/09/21 68  ?  ?  ?  ?  Passed - Valid encounter within last 6 months  ?  Recent Outpatient Visits   ? ?      ? 1 month ago Essential hypertension  ? [facility] [name], MD  ? 5 months ago Essential hypertension  ? [facility] [name], MD  ? ?  ?  ?Future Appointments   ? ?        ? In 2 months [name], MD [facility], [place]  ? ?  ? ?  ?  ?  ? ?

## 2021-10-02 NOTE — Telephone Encounter (Signed)
Refilled 10/02/2021 #[id number] refills. ?Requested Prescriptions  ?Pending Prescriptions Disp Refills  ?? verapamil (CALAN-SR) 180 MG CR tablet [Pharmacy Med Name: VERAPAMIL HYDROCHLORIDE ER 180 MG Tablet Extended Release] 90 tablet 0  ?  Sig: TAKE 1 TABLET (180 MG TOTAL) BY MOUTH AT BEDTIME.  ?  ? Cardiovascular: Calcium Channel Blockers 3 Passed - 10/02/2021 11:04 AM  ?  ?  Passed - ALT in normal range and within 360 days  ?  ALT  ?Date Value Ref Range Status  ?08/26/2021 17 0 - 32 IU/L Final  ?   ?  ?  Passed - AST in normal range and within 360 days  ?  AST  ?Date Value Ref Range Status  ?08/26/2021 15 0 - 40 IU/L Final  ?   ?  ?  Passed - Cr in normal range and within 360 days  ?  Creatinine, Ser  ?Date Value Ref Range Status  ?08/26/2021 0.74 0.57 - 1.00 mg/dL Final  ?   ?  ?  Passed - Last BP in normal range  ?  BP Readings from Last 1 Encounters:  ?09/09/21 112/78  ?   ?  ?  Passed - Last Heart Rate in normal range  ?  Pulse Readings from Last 1 Encounters:  ?09/09/21 68  ?   ?  ?  Passed - Valid encounter within last 6 months  ?  Recent Outpatient Visits   ?      ? 1 month ago Essential hypertension  ? [facility] [name], MD  ? 5 months ago Essential hypertension  ? [facility] [name], MD  ?  ?  ?Future Appointments   ?        ? In 2 months [name], MD [facility], [place]  ?  ? ?  ?  ?  ? ?

## 2021-10-15 ENCOUNTER — Other Ambulatory Visit: Payer: Self-pay

## 2021-10-15 ENCOUNTER — Ambulatory Visit
Admission: RE | Admit: 2021-10-15 | Discharge: 2021-10-15 | Disposition: A | Discharge status: Home / Self Care | Payer: Medicare HMO | Source: Ambulatory Visit | Attending: Internal Medicine | Admitting: Internal Medicine

## 2021-10-15 DIAGNOSIS — M8580 Other specified disorders of bone density and structure, unspecified site: Secondary | ICD-10-CM | POA: Insufficient documentation

## 2021-10-15 DIAGNOSIS — Z13820 Encounter for screening for osteoporosis: Secondary | ICD-10-CM | POA: Insufficient documentation

## 2021-10-15 DIAGNOSIS — M8588 Other specified disorders of bone density and structure, other site: Secondary | ICD-10-CM | POA: Diagnosis not present

## 2021-10-15 DIAGNOSIS — M810 Age-related osteoporosis without current pathological fracture: Secondary | ICD-10-CM | POA: Insufficient documentation

## 2021-11-12 ENCOUNTER — Other Ambulatory Visit: Payer: Self-pay | Admitting: Internal Medicine

## 2021-11-12 DIAGNOSIS — Z1231 Encounter for screening mammogram for malignant neoplasm of breast: Secondary | ICD-10-CM

## 2021-12-10 ENCOUNTER — Other Ambulatory Visit: Payer: Self-pay | Admitting: Internal Medicine

## 2021-12-10 NOTE — Telephone Encounter (Signed)
Requested Prescriptions  ?Pending Prescriptions Disp Refills  ?? verapamil (CALAN-SR) 180 MG CR tablet [Pharmacy Med Name: VERAPAMIL HYDROCHLORIDE ER 180 MG Tablet Extended Release] 90 tablet 0  ?  Sig: TAKE 1 TABLET AT BEDTIME  ?  ? Cardiovascular: Calcium Channel Blockers 3 Passed - 12/10/2021 12:59 AM  ?  ?  Passed - ALT in normal range and within 360 days  ?  ALT  ?Date Value Ref Range Status  ?08/26/2021 17 0 - 32 IU/L Final  ?   ?  ?  Passed - AST in normal range and within 360 days  ?  AST  ?Date Value Ref Range Status  ?08/26/2021 15 0 - 40 IU/L Final  ?   ?  ?  Passed - Cr in normal range and within 360 days  ?  Creatinine, Ser  ?Date Value Ref Range Status  ?08/26/2021 0.74 0.57 - 1.00 mg/dL Final  ?   ?  ?  Passed - Last BP in normal range  ?  BP Readings from Last 1 Encounters:  ?09/09/21 112/78  ?   ?  ?  Passed - Last Heart Rate in normal range  ?  Pulse Readings from Last 1 Encounters:  ?09/09/21 68  ?   ?  ?  Passed - Valid encounter within last 6 months  ?  Recent Outpatient Visits   ?      ? 3 months ago Essential hypertension  ? [facility] Reubin Milan, MD  ? 7 months ago Essential hypertension  ? [facility] Reubin Milan, MD  ?  ?  ?Future Appointments   ?        ? In 2 weeks Judithann Graves Nyoka Cowden, MD [facility] [place]  ?  ? ?  ?  ?  ? ?

## 2021-12-24 ENCOUNTER — Encounter: Payer: Self-pay | Admitting: Internal Medicine

## 2021-12-24 ENCOUNTER — Ambulatory Visit (INDEPENDENT_AMBULATORY_CARE_PROVIDER_SITE_OTHER): Payer: Medicare PPO | Admitting: Internal Medicine

## 2021-12-24 VITALS — BP 110/70 | HR 65 | Ht 67.0 in | Wt 227.0 lb

## 2021-12-24 DIAGNOSIS — E559 Vitamin D deficiency, unspecified: Secondary | ICD-10-CM | POA: Insufficient documentation

## 2021-12-24 DIAGNOSIS — E213 Hyperparathyroidism, unspecified: Secondary | ICD-10-CM | POA: Diagnosis not present

## 2021-12-24 DIAGNOSIS — I10 Essential (primary) hypertension: Secondary | ICD-10-CM | POA: Diagnosis not present

## 2021-12-24 NOTE — Progress Notes (Signed)
Date:  12/24/2021   Name:  Elizabeth Franco   DOB:  05/20/1946   MRN:  [id number]   Chief Complaint: Hypertension  Hypertension This is a chronic problem. The problem is controlled. Associated symptoms include peripheral edema (resolves overnight). Pertinent negatives include no blurred vision, chest pain, headaches, orthopnea, palpitations or shortness of breath. Past treatments include calcium channel blockers, angiotensin blockers and diuretics. There is no history of kidney disease, CAD/MI or CVA. Identifiable causes of hypertension include hyperparathyroidism.   Lab Results  Component Value Date   NA 140 08/26/2021   K 4.2 08/26/2021   CO2 23 08/26/2021   GLUCOSE 90 08/26/2021   BUN 12 08/26/2021   CREATININE 0.74 08/26/2021   CALCIUM 10.9 (H) 08/26/2021   EGFR 84 08/26/2021   Lab Results  Component Value Date   CHOL 173 04/25/2021   HDL 78 04/25/2021   LDLCALC 77 04/25/2021   TRIG 101 04/25/2021   CHOLHDL 2.2 04/25/2021   Lab Results  Component Value Date   TSH 1.540 04/25/2021   No results found for: HGBA1C Lab Results  Component Value Date   WBC 4.8 08/26/2021   HGB 13.7 08/26/2021   HCT 40.9 08/26/2021   MCV 90 08/26/2021   PLT 307 08/26/2021   Lab Results  Component Value Date   ALT 17 08/26/2021   AST 15 08/26/2021   ALKPHOS 87 08/26/2021   BILITOT 0.4 08/26/2021   Lab Results  Component Value Date   VD25OH 28.4 (L) 08/26/2021     Review of Systems  Constitutional:  Negative for chills, fatigue, fever and unexpected weight change.  Eyes:  Negative for blurred vision.  Respiratory:  Negative for chest tightness and shortness of breath.   Cardiovascular:  Positive for leg swelling. Negative for chest pain, palpitations and orthopnea.  Musculoskeletal:  Negative for gait problem and myalgias.  Neurological:  Negative for dizziness and headaches.  Psychiatric/Behavioral:  Negative for dysphoric mood and sleep disturbance. The patient is not  nervous/anxious.    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  Current Meds  Medication Sig   Ascorbic Acid (VITAMIN C) 1000 MG tablet Take 1,000 mg by mouth daily.   cholecalciferol (VITAMIN D3) 25 MCG (1000 UNIT) tablet Take 1,000 Units by mouth daily.   losartan-hydrochlorothiazide (HYZAAR) 100-25 MG tablet Take 1 tablet by mouth daily.   Omega-3 Fatty Acids (FISH OIL) 1000 MG CAPS Take by mouth.   simvastatin (ZOCOR) 10 MG tablet Take 10 mg by mouth daily.   verapamil (CALAN-SR) 180 MG CR tablet TAKE 1 TABLET AT BEDTIME   vitamin B-12 (CYANOCOBALAMIN) 500 MCG tablet Take 500 mcg by mouth daily.       12/24/2021  11:09 AM 08/26/2021    1:27 PM 04/24/2021    2:52 PM  GAD 7 : Generalized Anxiety Score  Nervous, Anxious, on Edge 0 0 0  Control/stop worrying 0 0 0  Worry too much - different things 0 0 0  Trouble relaxing 0 0 0  Restless 0 0 0  Easily annoyed or irritable 0 0 0  Afraid - awful might happen 0 0 0  Total GAD 7 Score 0 0 0  Anxiety Difficulty Not difficult at all Not difficult at all        12/24/2021   11:08 AM  Depression screen PHQ 2/9  Decreased Interest 0  Down, Depressed, Hopeless 0  PHQ - 2 Score 0  Altered sleeping 0  Tired, decreased energy 0   Change in appetite 0  Feeling bad or failure about yourself  0  Trouble concentrating 0  Moving slowly or fidgety/restless 0  Suicidal thoughts 0  PHQ-9 Score 0  Difficult doing work/chores Not difficult at all    BP Readings from Last 3 Encounters:  12/24/21 110/70  09/09/21 112/78  08/26/21 102/72    Physical Exam Vitals and nursing note reviewed.  Constitutional:      General: She is not in acute distress.    Appearance: Normal appearance. She is well-developed.  HENT:     Head: Normocephalic and atraumatic.  Neck:     Vascular: No carotid bruit.  Cardiovascular:     Rate and Rhythm: Normal rate and regular rhythm.     Pulses: Normal pulses.     Heart sounds: No murmur heard. Pulmonary:     Effort: Pulmonary effort is normal. No respiratory distress.     Breath sounds: No wheezing or rhonchi.  Musculoskeletal:     Cervical back: Normal range of motion.     Right lower leg: No edema.     Left lower leg: Edema (trace ankle edema) present.  Lymphadenopathy:     Cervical: No cervical adenopathy.  Skin:    General: Skin is warm and dry.     Capillary Refill: Capillary refill takes less than 2 seconds.     Findings: No rash.  Neurological:     General: No focal deficit present.     Mental Status: She is alert and oriented to person, place, and time.  Psychiatric:        Mood and Affect: Mood normal.        Behavior: Behavior normal.    Wt Readings from Last 3 Encounters:  12/24/21 227 lb (103 kg)  09/09/21 227 lb 6.4 oz (103.1 kg)  08/26/21 225 lb (102.1 kg)    BP 110/70   Pulse 65   Ht _0  (1.702 m)   Wt 227 lb (103 kg)   SpO2 95%   BMI 35.55 kg/m   Assessment and Plan: 1. Essential hypertension Clinically stable exam with well controlled BP. Tolerating medications without side effects at this time. Pt to continue current regimen and low sodium diet; benefits of regular exercise as able discussed. - Comprehensive metabolic panel  2.  Hyperparathyroidism ([place]) Continue to monitor labs, renal function - Comprehensive metabolic panel - Parathyroid hormone, intact (no Ca)  3. Vitamin D deficiency Started on supplements last visit  1000 IU daily Will check levels and advise on dose adjustment if needed - Vitamin D (25 hydroxy)   Partially dictated using Editor, commissioning. Any errors are unintentional.  Halina Maidens, MD [facility]  12/24/2021

## 2021-12-25 ENCOUNTER — Ambulatory Visit
Admission: RE | Admit: 2021-12-25 | Discharge: 2021-12-25 | Disposition: A | Discharge status: Home / Self Care | Payer: Medicare PPO | Source: Ambulatory Visit | Attending: Internal Medicine | Admitting: Internal Medicine

## 2021-12-25 DIAGNOSIS — Z1231 Encounter for screening mammogram for malignant neoplasm of breast: Secondary | ICD-10-CM | POA: Diagnosis not present

## 2021-12-25 LAB — COMPREHENSIVE METABOLIC PANEL
ALT: 17 IU/L (ref 0–32)
AST: 19 IU/L (ref 0–40)
Albumin/Globulin Ratio: 1.7 (ref 1.2–2.2)
Albumin: 4.3 g/dL (ref 3.7–4.7)
Alkaline Phosphatase: 73 IU/L (ref 44–121)
BUN/Creatinine Ratio: 15 (ref 12–28)
BUN: 9 mg/dL (ref 8–27)
Bilirubin Total: 0.5 mg/dL (ref 0.0–1.2)
CO2: 21 mmol/L (ref 20–29)
Calcium: 11.2 mg/dL — ABNORMAL HIGH (ref 8.7–10.3)
Chloride: 104 mmol/L (ref 96–106)
Creatinine, Ser: 0.59 mg/dL (ref 0.57–1.00)
Globulin, Total: 2.5 g/dL (ref 1.5–4.5)
Glucose: 78 mg/dL (ref 70–99)
Potassium: 4 mmol/L (ref 3.5–5.2)
Sodium: 141 mmol/L (ref 134–144)
Total Protein: 6.8 g/dL (ref 6.0–8.5)
eGFR: 93 mL/min/{1.73_m2} (ref >59)

## 2021-12-25 LAB — VITAMIN D 25 HYDROXY (VIT D DEFICIENCY, FRACTURES): Vit D, 25-Hydroxy: 29.4 ng/mL — ABNORMAL LOW (ref 30.0–100.0)

## 2021-12-25 LAB — PARATHYROID HORMONE, INTACT (NO CA): PTH: 36 pg/mL (ref 15–65)

## 2022-03-12 ENCOUNTER — Other Ambulatory Visit: Payer: Self-pay | Admitting: Internal Medicine

## 2022-03-12 NOTE — Telephone Encounter (Signed)
Requested Prescriptions  Pending Prescriptions Disp Refills  . losartan-hydrochlorothiazide (HYZAAR) 100-25 MG tablet [Pharmacy Med Name: LOSARTAN POTASSIUM/HYDROCHLOROTHIAZIDE 100-25 MG Tablet] 90 tablet 0    Sig: TAKE 1 TABLET EVERY DAY     Cardiovascular: ARB + Diuretic Combos Passed - 03/12/2022  3:47 AM      Passed - K in normal range and within 180 days    Potassium  Date Value Ref Range Status  12/24/2021 4.0 3.5 - 5.2 mmol/L Final         Passed - Na in normal range and within 180 days    Sodium  Date Value Ref Range Status  12/24/2021 141 134 - 144 mmol/L Final         Passed - Cr in normal range and within 180 days    Creatinine, Ser  Date Value Ref Range Status  12/24/2021 0.59 0.57 - 1.00 mg/dL Final         Passed - eGFR is 10 or above and within 180 days    GFR calc Af Amer  Date Value Ref Range Status  08/15/2020 146  Final   eGFR  Date Value Ref Range Status  12/24/2021 93 >59 mL/min/1.73 Final         Passed - Patient is not pregnant      Passed - Last BP in normal range    BP Readings from Last 1 Encounters:  12/24/21 110/70         Passed - Valid encounter within last 6 months    Recent Outpatient Visits          2 months ago Essential hypertension   [facility] [name], MD   6 months ago Essential hypertension   [place], Laura H, MD   10 months ago Essential hypertension   [facility] [name], MD      Future Appointments            In 1 month Army Melia, Jesse Sans, MD [facility], [facility]

## 2022-04-25 ENCOUNTER — Encounter: Payer: Self-pay | Admitting: Internal Medicine

## 2022-04-25 ENCOUNTER — Ambulatory Visit (INDEPENDENT_AMBULATORY_CARE_PROVIDER_SITE_OTHER): Payer: Medicare PPO | Admitting: Internal Medicine

## 2022-04-25 VITALS — BP 122/76 | HR 76 | Ht 67.0 in | Wt 228.0 lb

## 2022-04-25 DIAGNOSIS — E559 Vitamin D deficiency, unspecified: Secondary | ICD-10-CM

## 2022-04-25 DIAGNOSIS — E538 Deficiency of other specified B group vitamins: Secondary | ICD-10-CM | POA: Diagnosis not present

## 2022-04-25 DIAGNOSIS — Z0000 Encounter for general adult medical examination without abnormal findings: Secondary | ICD-10-CM

## 2022-04-25 DIAGNOSIS — E213 Hyperparathyroidism, unspecified: Secondary | ICD-10-CM

## 2022-04-25 DIAGNOSIS — E7800 Pure hypercholesterolemia, unspecified: Secondary | ICD-10-CM | POA: Diagnosis not present

## 2022-04-25 DIAGNOSIS — M170 Bilateral primary osteoarthritis of knee: Secondary | ICD-10-CM

## 2022-04-25 DIAGNOSIS — D72819 Decreased white blood cell count, unspecified: Secondary | ICD-10-CM

## 2022-04-25 DIAGNOSIS — I10 Essential (primary) hypertension: Secondary | ICD-10-CM

## 2022-04-25 DIAGNOSIS — Z23 Encounter for immunization: Secondary | ICD-10-CM | POA: Diagnosis not present

## 2022-04-25 MED ORDER — SIMVASTATIN 10 MG PO TABS: 10.0000 mg | ORAL_TABLET | Freq: Every day | ORAL | 1 refills | Status: DC

## 2022-04-25 NOTE — Progress Notes (Signed)
Date:  04/25/2022   Name:  Elizabeth Franco   DOB:  04-20-1946   MRN:  [id number]   Chief Complaint: Annual Exam Elizabeth Franco is a 76 y.o. female who presents today for her Complete Annual Exam. She feels well. She reports exercising - none. She reports she is sleeping fairly well. Breast complaints none.  Mammogram: 12/2021 DEXA: 10/2021 Colonoscopy: 2012  Health Maintenance Due  Topic Date Due   Zoster Vaccines- Shingrix (1 of 2) Never done   COVID-19 Vaccine (5 - Moderna risk series) 03/27/2021   INFLUENZA VACCINE  03/04/2022    Immunization History  Administered Date(s) Administered   Fluad Quad(high Dose 65+) 05/03/2019, 04/24/2021   Influenza, High Dose Seasonal PF 05/04/2018   Influenza-Unspecified 06/16/2015, 06/04/2016, 05/11/2020   Moderna Sars-Covid-2 Vaccination 08/21/2019, 09/18/2019, 06/06/2020, 01/30/2021   Pneumococcal Conjugate-13 08/01/2016   Pneumococcal Polysaccharide-23 11/09/2012   Tdap 01/09/2015    Hypertension This is a chronic problem. The problem is controlled. Pertinent negatives include no chest pain, headaches, palpitations or shortness of breath. Past treatments include calcium channel blockers, diuretics and angiotensin blockers. The current treatment provides significant improvement. There is no history of kidney disease, CAD/MI or CVA.  Hyperlipidemia This is a chronic problem. The problem is controlled. Associated symptoms include myalgias. Pertinent negatives include no chest pain or shortness of breath. Current antihyperlipidemic treatment includes statins. The current treatment provides significant improvement of lipids.  Knee Pain  There was no injury mechanism. The pain is present in the left knee. The quality of the pain is described as aching. The pain is moderate. The pain has been Fluctuating since onset. The symptoms are aggravated by weight bearing. She has tried acetaminophen for the symptoms. The treatment provided  significant relief.    Lab Results  Component Value Date   NA 141 12/24/2021   K 4.0 12/24/2021   CO2 21 12/24/2021   GLUCOSE 78 12/24/2021   BUN 9 12/24/2021   CREATININE 0.59 12/24/2021   CALCIUM 11.2 (H) 12/24/2021   EGFR 93 12/24/2021   Lab Results  Component Value Date   CHOL 173 04/25/2021   HDL 78 04/25/2021   LDLCALC 77 04/25/2021   TRIG 101 04/25/2021   CHOLHDL 2.2 04/25/2021   Lab Results  Component Value Date   TSH 1.540 04/25/2021   No results found for: "HGBA1C" Lab Results  Component Value Date   WBC 4.8 08/26/2021   HGB 13.7 08/26/2021   HCT 40.9 08/26/2021   MCV 90 08/26/2021   PLT 307 08/26/2021   Lab Results  Component Value Date   ALT 17 12/24/2021   AST 19 12/24/2021   ALKPHOS 73 12/24/2021   BILITOT 0.5 12/24/2021   Lab Results  Component Value Date   VD25OH 29.4 (L) 12/24/2021     Review of Systems  Constitutional:  Negative for chills, fatigue and fever.  HENT:  Negative for congestion, hearing loss, trouble swallowing and voice change.   Eyes:  Negative for visual disturbance.  Respiratory:  Negative for cough, chest tightness, shortness of breath and wheezing.   Cardiovascular:  Negative for chest pain, palpitations and leg swelling.  Gastrointestinal:  Negative for abdominal pain, constipation and diarrhea.  Endocrine: Negative for polydipsia and polyuria.  Genitourinary:  Negative for dysuria, frequency and urgency.  Musculoskeletal:  Positive for arthralgias, gait problem and myalgias. Negative for joint swelling.  Skin:  Negative for color change and rash.  Neurological:  Negative for dizziness, tremors, light-headedness and headaches.  Hematological:  Negative for adenopathy. Does not bruise/bleed easily.  Psychiatric/Behavioral:  Negative for dysphoric mood and sleep disturbance. The patient is not nervous/anxious.     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  Current Meds  Medication Sig   Ascorbic Acid (VITAMIN C) 1000 MG tablet Take 1,000 mg by mouth daily.   cholecalciferol (VITAMIN D3) 25 MCG (1000 UNIT) tablet Take 1,000 Units by mouth daily.   losartan-hydrochlorothiazide (HYZAAR) 100-25 MG tablet TAKE 1 TABLET EVERY DAY   Omega-3 Fatty Acids (FISH OIL) 1000 MG CAPS Take by mouth.   simvastatin (ZOCOR) 10 MG tablet Take 10 mg by mouth daily.   verapamil (CALAN-SR) 180 MG CR tablet TAKE 1 TABLET AT BEDTIME   vitamin B-12 (CYANOCOBALAMIN) 500 MCG tablet Take 500 mcg by mouth daily.       12/24/2021   11:09 AM 08/26/2021    1:27 PM 04/24/2021    2:52 PM  GAD 7 : Generalized Anxiety Score  Nervous, Anxious, on Edge 0 0 0  Control/stop worrying 0 0 0  Worry too much - different things 0 0 0  Trouble relaxing 0 0 0  Restless 0 0 0  Easily annoyed or irritable 0 0 0  Afraid - awful might happen 0 0 0  Total GAD 7 Score 0 0 0  Anxiety Difficulty Not difficult at all Not difficult at all        04/25/2022   10:58 AM 12/24/2021   11:08 AM 09/09/2021   11:02 AM  Depression screen PHQ 2/9  Decreased Interest 0 0 0  Down, Depressed, Hopeless 0 0 0  PHQ - 2 Score 0 0 0  Altered sleeping 0 0 0  Tired, decreased energy 0 0 0  Change in appetite  0 0 0  Feeling bad or failure about yourself  0 0 0  Trouble concentrating 0 0 0  Moving slowly or fidgety/restless 0 0 0  Suicidal thoughts 0 0 0  PHQ-9 Score 0 0 0  Difficult doing work/chores Not difficult at all Not difficult at all Not difficult at all    BP Readings from Last 3 Encounters:  04/25/22 122/76  12/24/21 110/70  09/09/21 112/78    Physical Exam Vitals and nursing note reviewed.  Constitutional:      General: She is not in acute distress.    Appearance: Normal appearance. She is well-developed. She is obese.  HENT:     Head: Normocephalic and atraumatic.     Right Ear: Tympanic membrane and ear canal normal.     Left Ear: Tympanic membrane and ear canal normal.     Nose:     Right Sinus: No  maxillary sinus tenderness.     Left Sinus: No maxillary sinus tenderness.  Eyes:     General: No scleral icterus.       Right eye: No discharge.        Left eye: No discharge.     Conjunctiva/sclera: Conjunctivae normal.  Neck:     Thyroid: No thyromegaly.     Vascular: No carotid bruit.  Cardiovascular:     Rate and Rhythm: Normal rate and regular rhythm.     Pulses: Normal pulses.     Heart sounds: Normal heart sounds.  Pulmonary:     Effort: Pulmonary effort is normal. No respiratory distress.     Breath sounds: No wheezing.  Chest:  Breasts:    Right: No mass, nipple discharge, skin change or tenderness.     Left: No mass, nipple discharge, skin change or tenderness.  Abdominal:     General: Bowel sounds are normal.     Palpations: Abdomen is soft.     Tenderness: There is no abdominal tenderness.  Musculoskeletal:     Cervical back: Normal range of motion. No erythema.     Right lower leg: No edema.     Left lower leg: No edema.  Lymphadenopathy:     Cervical: No cervical adenopathy.  Skin:    General: Skin is warm and dry.     Findings: No rash.  Neurological:     Mental Status: She is alert and oriented to person, place, and time.     Cranial  Nerves: No cranial nerve deficit.     Sensory: No sensory deficit.     Deep Tendon Reflexes: Reflexes are normal and symmetric.  Psychiatric:        Attention and Perception: Attention normal.        Mood and Affect: Mood normal.        Speech: Speech normal.     Wt Readings from Last 3 Encounters:  04/25/22 228 lb (103.4 kg)  12/24/21 227 lb (103 kg)  09/09/21 227 lb 6.4 oz (103.1 kg)    BP 122/76   Pulse 76   Ht _0  (1.702 m)   Wt 228 lb (103.4 kg)   SpO2 95%   BMI 35.71 kg/m   Assessment and Plan: 1. Annual physical exam Exam is normal except for weight. Encourage regular exercise and appropriate dietary changes. Up to date on screenings and immunizations.  2. Essential hypertension Clinically stable exam with well controlled BP. Tolerating medications without side effects at this time. Pt to continue current regimen and low sodium diet; benefits of regular exercise as able discussed. - TSH  3. Hyperparathyroidism ([place]) Monitoring every 4-6 months. - Parathyroid hormone, intact (no Ca) - Comprehensive metabolic panel  4. B12 deficiency Continue oral supplementation  5. Hypercholesterolemia On appropriate statin therapy Goal LDL < 70 - Lipid panel - simvastatin (ZOCOR) 10 MG tablet; Take 1 tablet (10 mg total) by mouth daily.  Dispense: 90 tablet; Refill: 1  6. Chronic leukopenia Recent WBC was low normal.  7. Vitamin D deficiency Continue supplementation daily - VITAMIN D 25 Hydroxy (Vit-D Deficiency, Fractures)  8. Need for vaccination for pneumococcus - Pneumococcal conjugate vaccine 20-valent  9. Primary osteoarthritis of both knees Doing well with Tylenol and topical rubs.   Partially dictated using Editor, commissioning. Any errors are unintentional.  Halina Maidens, MD [facility]  04/25/2022

## 2022-04-27 LAB — COMPREHENSIVE METABOLIC PANEL
ALT: 14 IU/L (ref 0–32)
AST: 16 IU/L (ref 0–40)
Albumin/Globulin Ratio: 1.8 (ref 1.2–2.2)
Albumin: 4.5 g/dL (ref 3.8–4.8)
Alkaline Phosphatase: 81 IU/L (ref 44–121)
BUN/Creatinine Ratio: 14 (ref 12–28)
BUN: 8 mg/dL (ref 8–27)
Bilirubin Total: 0.4 mg/dL (ref 0.0–1.2)
CO2: 21 mmol/L (ref 20–29)
Calcium: 11 mg/dL — ABNORMAL HIGH (ref 8.7–10.3)
Chloride: 103 mmol/L (ref 96–106)
Creatinine, Ser: 0.59 mg/dL (ref 0.57–1.00)
Globulin, Total: 2.5 g/dL (ref 1.5–4.5)
Glucose: 77 mg/dL (ref 70–99)
Potassium: 4 mmol/L (ref 3.5–5.2)
Sodium: 142 mmol/L (ref 134–144)
Total Protein: 7 g/dL (ref 6.0–8.5)
eGFR: 93 mL/min/{1.73_m2} (ref >59)

## 2022-04-27 LAB — LIPID PANEL
Chol/HDL Ratio: 2.1 ratio (ref 0.0–4.4)
Cholesterol, Total: 168 mg/dL (ref 100–199)
HDL: 81 mg/dL (ref >39)
LDL Chol Calc (NIH): 71 mg/dL (ref 0–99)
Triglycerides: 90 mg/dL (ref 0–149)
VLDL Cholesterol Cal: 16 mg/dL (ref 5–40)

## 2022-04-27 LAB — PARATHYROID HORMONE, INTACT (NO CA): PTH: 34 pg/mL (ref 15–65)

## 2022-04-27 LAB — TSH: TSH: 1.02 u[IU]/mL (ref 0.450–4.500)

## 2022-04-27 LAB — VITAMIN D 25 HYDROXY (VIT D DEFICIENCY, FRACTURES): Vit D, 25-Hydroxy: 33.9 ng/mL (ref 30.0–100.0)

## 2022-04-28 ENCOUNTER — Other Ambulatory Visit: Payer: Self-pay | Admitting: Internal Medicine

## 2022-04-28 DIAGNOSIS — I10 Essential (primary) hypertension: Secondary | ICD-10-CM

## 2022-04-28 MED ORDER — LOSARTAN POTASSIUM 100 MG PO TABS: 100.0000 mg | ORAL_TABLET | Freq: Every day | ORAL | 0 refills | Status: DC

## 2022-05-02 ENCOUNTER — Telehealth: Payer: Self-pay

## 2022-05-02 NOTE — Telephone Encounter (Signed)
Patient called and says she's confused about her medications simvastatin and losartan-hct 100-25. She says she received the new bottle of simvastatin and it says to take daily but she was taken it at night. I advised she can take it at night as long as it's once per day. She asks does Dr. Army Melia want me to take the losartan-hct. Advised per lab results, she's to take the losartan 100 mg due to the HCTZ may be contributing to the elevated calcium. Advised to stop taking the losartan-hct. She says that's all she has. Advised it was sent to [facility] on 04/28/22, so I will call and see if they received it and will call patient back. [place] called and spoke to [place], Education administrator about Losartan 100 mg sent on 04/28/22. She says it was mailed out on 04/30/22 and due to arrive at the patient's home on 05/03/22. Patient called and advised of the arrival date tomorrow and not to take anymore of the losartan-hct. She verbalized understanding.  [name], MD  04/28/2022 12:55 PM EDT     Calcium level is stable,  parathyroid hormone is normal.  All other labs are good.  I believe the elevated calcium is from the HCTZ in the losartan-hct.  I want to change this to Losartan alone and then recheck labs and BP in 2 months.

## 2022-05-05 ENCOUNTER — Other Ambulatory Visit: Payer: Self-pay | Admitting: Internal Medicine

## 2022-05-05 NOTE — Telephone Encounter (Signed)
Requested Prescriptions  Pending Prescriptions Disp Refills  . verapamil (CALAN-SR) 180 MG CR tablet [Pharmacy Med Name: VERAPAMIL HYDROCHLORIDE ER 180 MG Tablet Extended Release] 90 tablet 1    Sig: TAKE 1 TABLET AT BEDTIME     Cardiovascular: Calcium Channel Blockers 3 Passed - 05/05/2022  3:46 AM      Passed - ALT in normal range and within 360 days    ALT  Date Value Ref Range Status  04/25/2022 14 0 - 32 IU/L Final         Passed - AST in normal range and within 360 days    AST  Date Value Ref Range Status  04/25/2022 16 0 - 40 IU/L Final         Passed - Cr in normal range and within 360 days    Creatinine, Ser  Date Value Ref Range Status  04/25/2022 0.59 0.57 - 1.00 mg/dL Final         Passed - Last BP in normal range    BP Readings from Last 1 Encounters:  04/25/22 122/76         Passed - Last Heart Rate in normal range    Pulse Readings from Last 1 Encounters:  04/25/22 76         Passed - Valid encounter within last 6 months    Recent Outpatient Visits          1 week ago Annual physical exam   [place] [facility], Jesse Sans, MD   4 months ago Essential hypertension   [place] [facility] at [facility], Jesse Sans, MD   8 months ago Essential hypertension   [place] [facility] at [facility], Jesse Sans, MD   1 year ago Essential hypertension    [facility] and [facility] Medicine at [facility], Jesse Sans, MD      Future Appointments            In 1 month Army Melia, Jesse Sans, MD [facility] at [facility], [facility]   In 5 months Army Melia, Jesse Sans, MD [facility] at [facility], [facility]   In 11 months Army Melia, Jesse Sans, MD [place] [facility] at [facility], [facility]

## 2022-07-02 ENCOUNTER — Ambulatory Visit (INDEPENDENT_AMBULATORY_CARE_PROVIDER_SITE_OTHER): Payer: Medicare PPO | Admitting: Internal Medicine

## 2022-07-02 ENCOUNTER — Encounter: Payer: Self-pay | Admitting: Internal Medicine

## 2022-07-02 VITALS — BP 128/78 | HR 74 | Ht 67.0 in | Wt 225.0 lb

## 2022-07-02 DIAGNOSIS — I10 Essential (primary) hypertension: Secondary | ICD-10-CM | POA: Diagnosis not present

## 2022-07-02 DIAGNOSIS — E213 Hyperparathyroidism, unspecified: Secondary | ICD-10-CM

## 2022-07-02 MED ORDER — LOSARTAN POTASSIUM 100 MG PO TABS: 100.0000 mg | ORAL_TABLET | Freq: Every day | ORAL | 3 refills | Status: DC

## 2022-07-02 NOTE — Progress Notes (Signed)
Date:  07/02/2022   Name:  Elizabeth Franco   DOB:  12-04-1945   MRN:  [id number]   Chief Complaint: Hypertension  Hypertension This is a chronic problem. The problem is controlled. Pertinent negatives include no chest pain, headaches, palpitations or shortness of breath. Past treatments include calcium channel blockers (hctz stopped due elevated Calcium). The current treatment provides significant improvement. There are no compliance problems.  There is no history of kidney disease, CAD/MI or CVA.    Lab Results  Component Value Date   NA 142 04/25/2022   K 4.0 04/25/2022   CO2 21 04/25/2022   GLUCOSE 77 04/25/2022   BUN 8 04/25/2022   CREATININE 0.59 04/25/2022   CALCIUM 11.0 (H) 04/25/2022   EGFR 93 04/25/2022   Lab Results  Component Value Date   CHOL 168 04/25/2022   HDL 81 04/25/2022   LDLCALC 71 04/25/2022   TRIG 90 04/25/2022   CHOLHDL 2.1 04/25/2022   Lab Results  Component Value Date   TSH 1.020 04/25/2022   No results found for: "HGBA1C" Lab Results  Component Value Date   WBC 4.8 08/26/2021   HGB 13.7 08/26/2021   HCT 40.9 08/26/2021   MCV 90 08/26/2021   PLT 307 08/26/2021   Lab Results  Component Value Date   ALT 14 04/25/2022   AST 16 04/25/2022   ALKPHOS 81 04/25/2022   BILITOT 0.4 04/25/2022   Lab Results  Component Value Date   VD25OH 33.9 04/25/2022     Review of Systems  Constitutional:  Negative for fatigue and unexpected weight change.  HENT:  Negative for nosebleeds.   Eyes:  Negative for visual disturbance.  Respiratory:  Negative for cough, chest tightness, shortness of breath and wheezing.   Cardiovascular:  Negative for chest pain, palpitations and leg swelling.  Gastrointestinal:  Negative for abdominal pain, constipation and diarrhea.  Neurological:  Negative for dizziness, weakness, light-headedness and headaches.    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  Current Meds  Medication Sig   cholecalciferol (VITAMIN D3) 25 MCG (1000 UNIT) tablet Take 1,000 Units by mouth daily.   losartan (COZAAR) 100 MG tablet Take 1 tablet (100 mg total) by mouth daily.   simvastatin (ZOCOR) 10 MG tablet Take 1 tablet (10 mg total) by mouth daily.   verapamil (CALAN-SR) 180 MG CR tablet TAKE 1 TABLET AT BEDTIME   [DISCONTINUED] Ascorbic Acid (VITAMIN C) 1000 MG tablet Take 1,000 mg by mouth daily.       07/02/2022   10:43 AM 12/24/2021   11:09 AM 08/26/2021    1:27 PM 04/24/2021    2:52 PM  GAD 7 : Generalized  Anxiety Score  Nervous, Anxious, on Edge 0 0 0 0  Control/stop worrying 0 0 0 0  Worry too much - different things 0 0 0 0  Trouble relaxing 0 0 0 0  Restless 0 0 0 0  Easily annoyed or irritable 0 0 0 0  Afraid - awful might happen 0 0 0 0  Total GAD 7 Score 0 0 0 0  Anxiety Difficulty Not difficult at all Not difficult at all Not difficult at all        07/02/2022   10:42 AM 04/25/2022   10:58 AM 12/24/2021   11:08 AM  Depression screen PHQ 2/9  Decreased Interest 0 0 0  Down, Depressed, Hopeless 0 0 0  PHQ - 2 Score 0 0 0  Altered sleeping 0 0 0  Tired, decreased energy 0 0 0  Change in appetite 0 0 0  Feeling bad or failure about yourself   0 0 0  Trouble concentrating 0 0 0  Moving slowly or fidgety/restless 0 0 0  Suicidal thoughts 0 0 0  PHQ-9 Score 0 0 0  Difficult doing work/chores Not difficult at all Not difficult at all Not difficult at all    BP Readings from Last 3 Encounters:  07/02/22 128/78  04/25/22 122/76  12/24/21 110/70    Physical Exam Vitals and nursing note reviewed.  Constitutional:      General: She is not in acute distress.    Appearance: Normal appearance. She is well-developed.  HENT:     Head: Normocephalic and atraumatic.  Cardiovascular:     Rate and Rhythm: Normal rate and regular rhythm.  Pulmonary:     Effort: Pulmonary effort is normal. No respiratory distress.     Breath sounds: No wheezing or rhonchi.  Musculoskeletal:     Cervical back: Normal range of motion.     Right lower leg: No edema.     Left lower leg: No edema.  Lymphadenopathy:     Cervical: No cervical adenopathy.  Skin:    General: Skin is warm and dry.     Findings: No rash.  Neurological:     General: No focal deficit present.     Mental Status: She is alert and oriented to person, place, and time.     Sensory: No sensory deficit.     Gait: Gait normal.     Deep Tendon Reflexes: Reflexes normal.  Psychiatric:        Mood and Affect: Mood normal.        Behavior: Behavior normal.     Wt Readings from Last 3 Encounters:  07/02/22 225 lb (102.1 kg)  04/25/22 228 lb (103.4 kg)  12/24/21 227 lb (103 kg)    BP 128/78   Pulse 74   Ht _0  (1.702 m)   Wt 225 lb (102.1 kg)   SpO2 96%   BMI 35.24 kg/m   Assessment and Plan: 1. Essential hypertension Clinically stable exam with well controlled BP since stopping hctz Tolerating medications without side effects at this time. Will recheck calcium level. Pt to continue current regimen and low sodium diet; benefits of regular exercise as able discussed. - losartan (COZAAR) 100 MG tablet; Take 1 tablet (100 mg total) by mouth daily.  Dispense: 90 tablet;  Refill: 3 - Comprehensive metabolic panel  2. Hyperparathyroidism ([place]) Normal PTH in the setting of high calcium Hopefully improved after stopping HCTZ - Comprehensive metabolic panel   Partially dictated using Dragon software. Any  errors are unintentional.  Halina Maidens, MD [facility]  07/02/2022

## 2022-07-03 ENCOUNTER — Other Ambulatory Visit: Payer: Self-pay

## 2022-07-03 LAB — COMPREHENSIVE METABOLIC PANEL
ALT: 12 IU/L (ref 0–32)
AST: 12 IU/L (ref 0–40)
Albumin/Globulin Ratio: 1.8 (ref 1.2–2.2)
Albumin: 4.6 g/dL (ref 3.8–4.8)
Alkaline Phosphatase: 80 IU/L (ref 44–121)
BUN/Creatinine Ratio: 14 (ref 12–28)
BUN: 10 mg/dL (ref 8–27)
Bilirubin Total: 0.5 mg/dL (ref 0.0–1.2)
CO2: 21 mmol/L (ref 20–29)
Calcium: 11.3 mg/dL — ABNORMAL HIGH (ref 8.7–10.3)
Chloride: 105 mmol/L (ref 96–106)
Creatinine, Ser: 0.72 mg/dL (ref 0.57–1.00)
Globulin, Total: 2.5 g/dL (ref 1.5–4.5)
Glucose: 97 mg/dL (ref 70–99)
Potassium: 4.3 mmol/L (ref 3.5–5.2)
Sodium: 140 mmol/L (ref 134–144)
Total Protein: 7.1 g/dL (ref 6.0–8.5)
eGFR: 86 mL/min/{1.73_m2} (ref >59)

## 2022-07-16 ENCOUNTER — Telehealth: Payer: Self-pay | Admitting: Internal Medicine

## 2022-07-16 NOTE — Telephone Encounter (Signed)
Sent message to our Angelique Blonder in our referral department.  KP

## 2022-07-16 NOTE — Telephone Encounter (Signed)
Copied from CRM [id number]. Topic: Referral - Status >> Jul 16, 2022  9:42 AM Franchot Heidelberg wrote: Reason for CRM: Pt called to report that [facility] is not in her network. She needs a new referral within her [facility] network

## 2022-08-01 DIAGNOSIS — H524 Presbyopia: Secondary | ICD-10-CM | POA: Diagnosis not present

## 2022-08-01 DIAGNOSIS — E7800 Pure hypercholesterolemia, unspecified: Secondary | ICD-10-CM | POA: Diagnosis not present

## 2022-08-01 DIAGNOSIS — I10 Essential (primary) hypertension: Secondary | ICD-10-CM | POA: Diagnosis not present

## 2022-08-01 DIAGNOSIS — H25813 Combined forms of age-related cataract, bilateral: Secondary | ICD-10-CM | POA: Diagnosis not present

## 2022-08-22 ENCOUNTER — Other Ambulatory Visit: Payer: Self-pay | Admitting: Internal Medicine

## 2022-08-22 DIAGNOSIS — Z1231 Encounter for screening mammogram for malignant neoplasm of breast: Secondary | ICD-10-CM

## 2022-09-15 ENCOUNTER — Ambulatory Visit: Payer: Medicare HMO

## 2022-09-18 ENCOUNTER — Ambulatory Visit (INDEPENDENT_AMBULATORY_CARE_PROVIDER_SITE_OTHER): Payer: Medicare PPO

## 2022-09-18 VITALS — BP 138/78 | Ht 67.0 in | Wt 223.4 lb

## 2022-09-18 DIAGNOSIS — Z0000 Encounter for general adult medical examination without abnormal findings: Secondary | ICD-10-CM | POA: Diagnosis not present

## 2022-09-18 NOTE — Patient Instructions (Signed)
Elizabeth Franco , Thank you for taking time to come for your Medicare Wellness Visit. I appreciate your ongoing commitment to your health goals. Please review the following plan we discussed and let me know if I can assist you in the future.   These are the goals we discussed:  Goals      DIET - EAT MORE FRUITS AND VEGETABLES     Increase physical activity     Pt would like to increase physical activity to at least 3 days per week         This is a list of the screening recommended for you and due dates:  Health Maintenance  Topic Date Due   Zoster (Shingles) Vaccine (1 of 2) Never done   COVID-19 Vaccine (5 - 2023-24 season) 04/04/2022   Mammogram  12/26/2022   Medicare Annual Wellness Visit  09/19/2023   DTaP/Tdap/Td vaccine (2 - Td or Tdap) 01/08/2025   Pneumonia Vaccine  Completed   Flu Shot  Completed   DEXA scan (bone density measurement)  Completed   Hepatitis C Screening: USPSTF Recommendation to screen - Ages 28-79 yo.  Completed   HPV Vaccine  Aged Out   Colon Cancer Screening  Discontinued    Advanced directives: no  Conditions/risks identified: none  Next appointment: Follow up in one year for your annual wellness visit 09/23/23 @ 3:00 pm in person   Preventive Care 65 Years and Older, Female Preventive care refers to lifestyle choices and visits with your health care provider that can promote health and wellness. What does preventive care include? A yearly physical exam. This is also called an annual well check. Dental exams once or twice a year. Routine eye exams. Ask your health care provider how often you should have your eyes checked. Personal lifestyle choices, including: Daily care of your teeth and gums. Regular physical activity. Eating a healthy diet. Avoiding tobacco and drug use. Limiting alcohol use. Practicing safe sex. Taking low-dose aspirin every day. Taking vitamin and mineral supplements as recommended by your health care provider. What  happens during an annual well check? The services and screenings done by your health care provider during your annual well check will depend on your age, overall health, lifestyle risk factors, and family history of disease. Counseling  Your health care provider may ask you questions about your: Alcohol use. Tobacco use. Drug use. Emotional well-being. Home and relationship well-being. Sexual activity. Eating habits. History of falls. Memory and ability to understand (cognition). Work and work Statistician. Reproductive health. Screening  You may have the following tests or measurements: Height, weight, and BMI. Blood pressure. Lipid and cholesterol levels. These may be checked every 5 years, or more frequently if you are over 61 years old. Skin check. Lung cancer screening. You may have this screening every year starting at age 13 if you have a 30-pack-year history of smoking and currently smoke or have quit within the past 15 years. Fecal occult blood test (FOBT) of the stool. You may have this test every year starting at age 62. Flexible sigmoidoscopy or colonoscopy. You may have a sigmoidoscopy every 5 years or a colonoscopy every 10 years starting at age 60. Hepatitis C blood test. Hepatitis B blood test. Sexually transmitted disease (STD) testing. Diabetes screening. This is done by checking your blood sugar (glucose) after you have not eaten for a while (fasting). You may have this done every 1-3 years. Bone density scan. This is done to screen for osteoporosis. You may  have this done starting at age 17. Mammogram. This may be done every 1-2 years. Talk to your health care provider about how often you should have regular mammograms. Talk with your health care provider about your test results, treatment options, and if necessary, the need for more tests. Vaccines  Your health care provider may recommend certain vaccines, such as: Influenza vaccine. This is recommended every  year. Tetanus, diphtheria, and acellular pertussis (Tdap, Td) vaccine. You may need a Td booster every 10 years. Zoster vaccine. You may need this after age 52. Pneumococcal 13-valent conjugate (PCV13) vaccine. One dose is recommended after age 23. Pneumococcal polysaccharide (PPSV23) vaccine. One dose is recommended after age 76. Talk to your health care provider about which screenings and vaccines you need and how often you need them. This information is not intended to replace advice given to you by your health care provider. Make sure you discuss any questions you have with your health care provider. Document Released: 08/17/2015 Document Revised: 04/09/2016 Document Reviewed: 05/22/2015 Elsevier Interactive Patient Education  2017 [employer] Prevention in the Home Falls can cause injuries. They can happen to people of all ages. There are many things you can do to make your home safe and to help prevent falls. What can I do on the outside of my home? Regularly fix the edges of walkways and driveways and fix any cracks. Remove anything that might make you trip as you walk through a door, such as a raised step or threshold. Trim any bushes or trees on the path to your home. Use bright outdoor lighting. Clear any walking paths of anything that might make someone trip, such as rocks or tools. Regularly check to see if handrails are loose or broken. Make sure that both sides of any steps have handrails. Any raised decks and porches should have guardrails on the edges. Have any leaves, snow, or ice cleared regularly. Use sand or salt on walking paths during winter. Clean up any spills in your garage right away. This includes oil or grease spills. What can I do in the bathroom? Use night lights. Install grab bars by the toilet and in the tub and shower. Do not use towel bars as grab bars. Use non-skid mats or decals in the tub or shower. If you need to sit down in the shower, use a  plastic, non-slip stool. Keep the floor dry. Clean up any water that spills on the floor as soon as it happens. Remove soap buildup in the tub or shower regularly. Attach bath mats securely with double-sided non-slip rug tape. Do not have throw rugs and other things on the floor that can make you trip. What can I do in the bedroom? Use night lights. Make sure that you have a light by your bed that is easy to reach. Do not use any sheets or blankets that are too big for your bed. They should not hang down onto the floor. Have a firm chair that has side arms. You can use this for support while you get dressed. Do not have throw rugs and other things on the floor that can make you trip. What can I do in the kitchen? Clean up any spills right away. Avoid walking on wet floors. Keep items that you use a lot in easy-to-reach places. If you need to reach something above you, use a strong step stool that has a grab bar. Keep electrical cords out of the way. Do not use floor polish  or wax that makes floors slippery. If you must use wax, use non-skid floor wax. Do not have throw rugs and other things on the floor that can make you trip. What can I do with my stairs? Do not leave any items on the stairs. Make sure that there are handrails on both sides of the stairs and use them. Fix handrails that are broken or loose. Make sure that handrails are as long as the stairways. Check any carpeting to make sure that it is firmly attached to the stairs. Fix any carpet that is loose or worn. Avoid having throw rugs at the top or bottom of the stairs. If you do have throw rugs, attach them to the floor with carpet tape. Make sure that you have a light switch at the top of the stairs and the bottom of the stairs. If you do not have them, ask someone to add them for you. What else can I do to help prevent falls? Wear shoes that: Do not have high heels. Have rubber bottoms. Are comfortable and fit you  well. Are closed at the toe. Do not wear sandals. If you use a stepladder: Make sure that it is fully opened. Do not climb a closed stepladder. Make sure that both sides of the stepladder are locked into place. Ask someone to hold it for you, if possible. Clearly mark and make sure that you can see: Any grab bars or handrails. First and last steps. Where the edge of each step is. Use tools that help you move around (mobility aids) if they are needed. These include: Canes. Walkers. Scooters. Crutches. Turn on the lights when you go into a dark area. Replace any light bulbs as soon as they burn out. Set up your furniture so you have a clear path. Avoid moving your furniture around. If any of your floors are uneven, fix them. If there are any pets around you, be aware of where they are. Review your medicines with your doctor. Some medicines can make you feel dizzy. This can increase your chance of falling. Ask your doctor what other things that you can do to help prevent falls. This information is not intended to replace advice given to you by your health care provider. Make sure you discuss any questions you have with your health care provider. Document Released: 05/17/2009 Document Revised: 12/27/2015 Document Reviewed: 08/25/2014 Elsevier Interactive Patient Education  2017 [employer].

## 2022-09-18 NOTE — Progress Notes (Signed)
Subjective:   Legacy Kuznik is a 77 y.o. female who presents for Medicare Annual (Subsequent) preventive examination.  Review of Systems     Cardiac Risk Factors include: advanced age (>68mn, >>57women);hypertension;dyslipidemia;obesity (BMI >30kg/m2)     Objective:    Today's Vitals   09/18/22 1032  BP: 138/78  Weight: 223 lb 6.4 oz (101.3 kg)  Height: 5' 7"$  (1.702 m)   Body mass index is 34.99 kg/m.     09/18/2022   10:39 AM 09/09/2021   11:04 AM 06/03/2017    9:44 AM  Advanced Directives  Does Patient Have a Medical Advance Directive? No No No  Would patient like information on creating a medical advance directive? No - Patient declined Yes (MAU/Ambulatory/Procedural Areas - Information given)     Current Medications (verified) Outpatient Encounter Medications as of 09/18/2022  Medication Sig   cholecalciferol (VITAMIN D3) 25 MCG (1000 UNIT) tablet Take 1,000 Units by mouth daily.   losartan (COZAAR) 100 MG tablet Take 1 tablet (100 mg total) by mouth daily.   simvastatin (ZOCOR) 10 MG tablet Take 1 tablet (10 mg total) by mouth daily.   TURMERIC PO Take 1 capsule by mouth daily. 2580m  verapamil (CALAN-SR) 180 MG CR tablet TAKE 1 TABLET AT BEDTIME   No facility-administered encounter medications on file as of 09/18/2022.    Allergies (verified) Patient has no known allergies.   History: Past Medical History:  Diagnosis Date   Hyperlipemia    Hypertension    Past Surgical History:  Procedure Laterality Date   ABDOMINAL HYSTERECTOMY     BREAST BIOPSY Left    neg   Family History  Problem Relation Age of Onset   Hypertension Mother    Breast cancer Mother [id number] Breast cancer Maternal Aunt    Diabetes Maternal Grandmother    Breast cancer Maternal Grandmother [id number] Social History   Socioeconomic History   Marital status: Widowed    Spouse name: Not on file   Number of children: 5   Years of education: Not on file   Highest education level:  Not on file  Occupational History   Not on file  Tobacco Use   Smoking status: Never   Smokeless tobacco: Never  Vaping Use   Vaping Use: Never used  Substance and Sexual Activity   Alcohol use: No   Drug use: Not Currently   Sexual activity: Not Currently  Other Topics Concern   Not on file  Social History Narrative   3 living children   Pt lives alone; grandson stays sometimes   Social Determinants of Health   Financial Resource Strain: Low Risk  (09/18/2022)   Overall Financial Resource Strain (CARDIA)    Difficulty of Paying Living Expenses: Not very hard  Food Insecurity: No Food Insecurity (09/18/2022)   Hunger Vital Sign    Worried About Running Out of Food in the Last Year: Never true    Ran Out of Food in the Last Year: Never true  Transportation Needs: No Transportation Needs (09/18/2022)   PRAPARE - TrHydrologistMedical): No    Lack of Transportation (Non-Medical): No  Physical Activity: Insufficiently Active (09/18/2022)   Exercise Vital Sign    Days of Exercise per Week: 3 days    Minutes of Exercise per Session: 30 min  Stress: No Stress Concern Present (09/18/2022)   [place]  Feeling  of Stress : Not at all  Social Connections: Moderately Integrated (09/18/2022)   Social Connection and Isolation Panel [NHANES]    Frequency of Communication with Friends and Family: More than three times a week    Frequency of Social Gatherings with Friends and Family: More than three times a week    Attends Religious Services: More than 4 times per year    Active Member of [employer] or Organizations: Yes    Attends Archivist Meetings: More than 4 times per year    Marital Status: Widowed    Tobacco Counseling Counseling given: Not Answered   Clinical Intake:  Pre-visit preparation completed: Yes  Pain : No/denies pain     Diabetes: No  How often do you need to  have someone help you when you read instructions, pamphlets, or other written materials from your doctor or pharmacy?: 1 - Never  Diabetic?no  Interpreter Needed?: No  Information entered by :: Kirke Shaggy, LPN   Activities of Daily Living    09/18/2022   10:41 AM  In your present state of health, do you have any difficulty performing the following activities:  Hearing? 0  Vision? 0  Difficulty concentrating or making decisions? 0  Walking or climbing stairs? 0  Dressing or bathing? 0  Doing errands, shopping? 0  Preparing Food and eating ? N  Using the Toilet? N  In the past six months, have you accidently leaked urine? N  Do you have problems with loss of bowel control? N  Managing your Medications? N  Managing your Finances? N  Housekeeping or managing your Housekeeping? N    Patient Care Team: [name], MD as PCP - General (Internal Medicine)  Indicate any recent Medical Services you may have received from other than [facility] providers in the past year (date may be approximate).     Assessment:   This is a routine wellness examination for Jahzeel.  Hearing/Vision screen Hearing Screening - Comments:: No aids Vision Screening - Comments:: Wears glasses- [facility])   Dietary issues and exercise activities discussed: Current Exercise Habits: Home exercise routine, Type of exercise: walking, Time (Minutes): 30, Frequency (Times/Week): 3, Weekly Exercise (Minutes/Week): 90, Intensity: Mild   Goals Addressed             This Visit's Progress    DIET - EAT MORE FRUITS AND VEGETABLES         Depression Screen    09/18/2022   10:37 AM 07/02/2022   10:42 AM 04/25/2022   10:58 AM 12/24/2021   11:08 AM 09/09/2021   11:02 AM 08/26/2021    1:27 PM 04/24/2021    2:51 PM  PHQ 2/9 Scores  PHQ - 2 Score 0 0 0 0 0 0 3  PHQ- 9 Score 0 0 0 0 0 0 5    Fall Risk    09/18/2022   10:41 AM 07/02/2022   10:43 AM 12/24/2021   11:09 AM 09/09/2021   11:10 AM  08/26/2021    1:27 PM  Fall Risk   Falls in the past year? 0 0 0 0 0  Number falls in past yr: 0 0 0 0 0  Injury with Fall? 0 0 0 0 0  Risk for fall due to : No Fall Risks No Fall Risks No Fall Risks No Fall Risks No Fall Risks  Follow up Falls prevention discussed;Falls evaluation completed Falls evaluation completed Falls evaluation completed Falls prevention discussed Falls evaluation completed  FALL RISK PREVENTION PERTAINING TO THE HOME:  Any stairs in or around the home? Yes  If so, are there any without handrails? No  Home free of loose throw rugs in walkways, pet beds, electrical cords, etc? Yes  Adequate lighting in your home to reduce risk of falls? Yes   ASSISTIVE DEVICES UTILIZED TO PREVENT FALLS:  Life alert? No  Use of a cane, walker or w/c? No  Grab bars in the bathroom? No  Shower chair or bench in shower? No  Elevated toilet seat or a handicapped toilet? Yes   TIMED UP AND GO:  Was the test performed? Yes .  Length of time to ambulate 10 feet: 4 sec.   Gait steady and fast without use of assistive device  Cognitive Function:        09/18/2022   10:42 AM 09/09/2021   11:11 AM  6CIT Screen  What Year? 0 points 0 points  What month? 0 points 0 points  What time? 0 points 0 points  Count back from 20 0 points 0 points  Months in reverse 0 points 2 points  Repeat phrase 6 points 4 points  Total Score 6 points 6 points    Immunizations Immunization History  Administered Date(s) Administered   Fluad Quad(high Dose 65+) 05/03/2019, 04/24/2021, 04/25/2022   Influenza, High Dose Seasonal PF 05/04/2018   Influenza-Unspecified 06/16/2015, 06/04/2016, 05/11/2020   Moderna Sars-Covid-2 Vaccination 08/21/2019, 09/18/2019, 06/06/2020, 01/30/2021   PNEUMOCOCCAL CONJUGATE-20 04/25/2022   Pneumococcal Conjugate-13 08/01/2016   Pneumococcal Polysaccharide-23 11/09/2012   Tdap 01/09/2015    TDAP status: Up to date  Flu Vaccine status: Declined, Education has  been provided regarding the importance of this vaccine but patient still declined. Advised may receive this vaccine at local pharmacy or Health Dept. Aware to provide a copy of the vaccination record if obtained from local pharmacy or Health Dept. Verbalized acceptance and understanding.  Pneumococcal vaccine status: Up to date  Covid-19 vaccine status: Completed vaccines  Qualifies for Shingles Vaccine? Yes   Zostavax completed No   Shingrix Completed?: No.    Education has been provided regarding the importance of this vaccine. Patient has been advised to call insurance company to determine out of pocket expense if they have not yet received this vaccine. Advised may also receive vaccine at local pharmacy or Health Dept. Verbalized acceptance and understanding.  Screening Tests Health Maintenance  Topic Date Due   Zoster Vaccines- Shingrix (1 of 2) Never done   COVID-19 Vaccine (5 - 2023-24 season) 04/04/2022   MAMMOGRAM  12/26/2022   Medicare Annual Wellness (AWV)  09/19/2023   DTaP/Tdap/Td (2 - Td or Tdap) 01/08/2025   Pneumonia Vaccine 87+ Years old  Completed   INFLUENZA VACCINE  Completed   DEXA SCAN  Completed   Hepatitis C Screening  Completed   HPV VACCINES  Aged Out   COLONOSCOPY (Pts 45-58yr Insurance coverage will need to be confirmed)  Discontinued    Health Maintenance  Health Maintenance Due  Topic Date Due   Zoster Vaccines- Shingrix (1 of 2) Never done   COVID-19 Vaccine (5 - 2023-24 season) 04/04/2022    Colorectal cancer screening: No longer required.   Mammogram status: Completed scheduled for 12/31/22. Repeat every year  Bone Density status: Completed 10/15/21. Results reflect: Bone density results: OSTEOPOROSIS. Repeat every 2 years.  Lung Cancer Screening: (Low Dose CT Chest recommended if Age 765-80years, 30 pack-year currently smoking OR have quit w/in 15years.) does not qualify.  Additional Screening:  Hepatitis C Screening: does qualify;  Completed 08/12/19  Vision Screening: Recommended annual ophthalmology exams for early detection of glaucoma and other disorders of the eye. Is the patient up to date with their annual eye exam?  Yes  Who is the provider or what is the name of the office in which the patient attends annual eye exams? MD in [place] If pt is not established with a provider, would they like to be referred to a provider to establish care? No .   Dental Screening: Recommended annual dental exams for proper oral hygiene  Community Resource Referral / Chronic Care Management: CRR required this visit?  No   CCM required this visit?  No      Plan:     I have personally reviewed and noted the following in the patient's chart:   Medical and social history Use of alcohol, tobacco or illicit drugs  Current medications and supplements including opioid prescriptions. Patient is not currently taking opioid prescriptions. Functional ability and status Nutritional status Physical activity Advanced directives List of other physicians Hospitalizations, surgeries, and ER visits in previous 12 months Vitals Screenings to include cognitive, depression, and falls Referrals and appointments  In addition, I have reviewed and discussed with patient certain preventive protocols, quality metrics, and best practice recommendations. A written personalized care plan for preventive services as well as general preventive health recommendations were provided to patient.     Dionisio David, LPN   [phone]   Nurse Notes: none

## 2022-09-21 ENCOUNTER — Other Ambulatory Visit: Payer: Self-pay | Admitting: Internal Medicine

## 2022-09-21 DIAGNOSIS — E7800 Pure hypercholesterolemia, unspecified: Secondary | ICD-10-CM

## 2022-10-07 DIAGNOSIS — M810 Age-related osteoporosis without current pathological fracture: Secondary | ICD-10-CM | POA: Diagnosis not present

## 2022-10-11 IMAGING — MG MM DIGITAL SCREENING BILAT W/ TOMO AND CAD
6 of 10 series · 6 of 30 positions shown · non-contrast
Comparison: Previous exam(s).

CLINICAL DATA: Screening.

EXAM:
DIGITAL SCREENING BILATERAL MAMMOGRAM WITH TOMOSYNTHESIS AND CAD
TECHNIQUE: Bilateral screening digital craniocaudal and mediolateral oblique
mammograms were obtained. Bilateral screening digital breast
tomosynthesis was performed. The images were evaluated with
computer-aided detection.

[R MLO synth-2D]
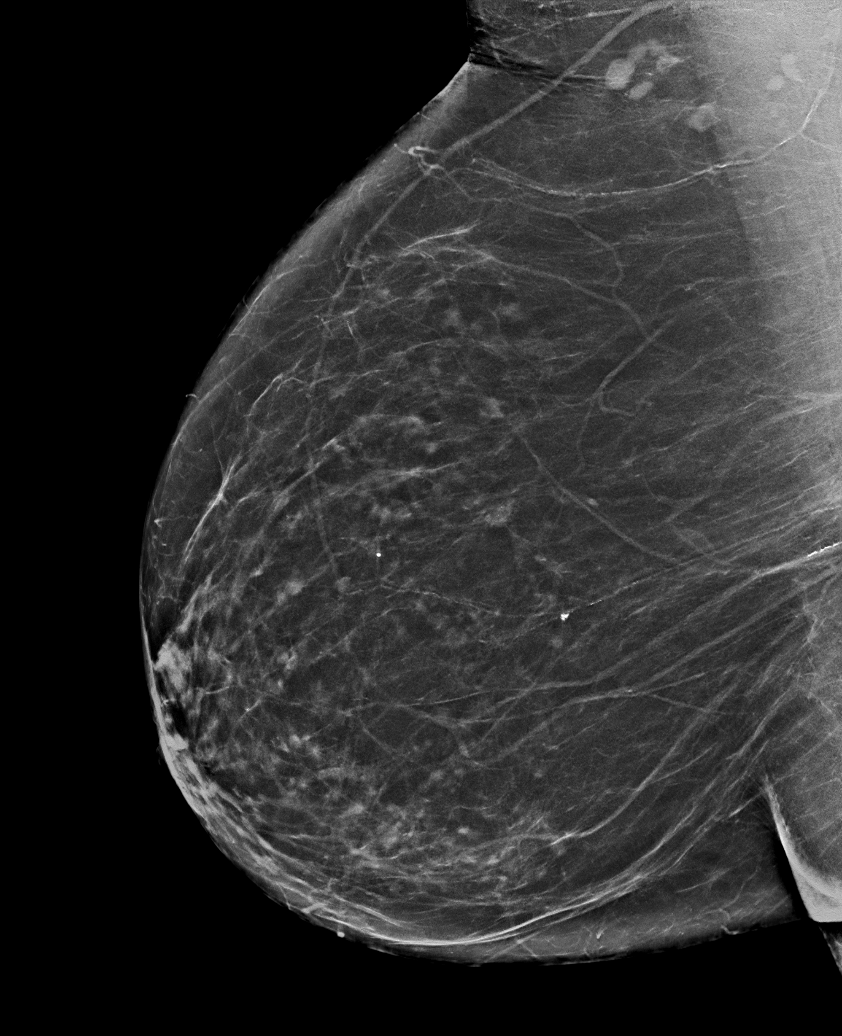

[L MLO synth-2D (1 of 2)]
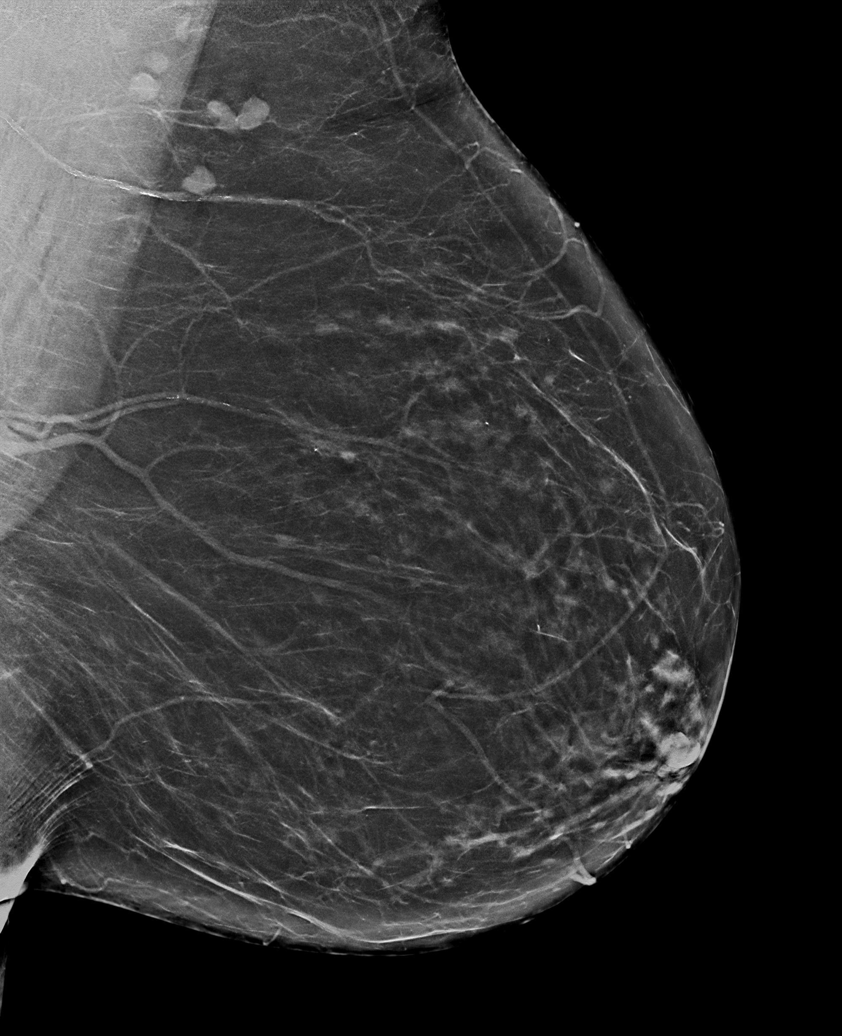

[R CC synth-2D]
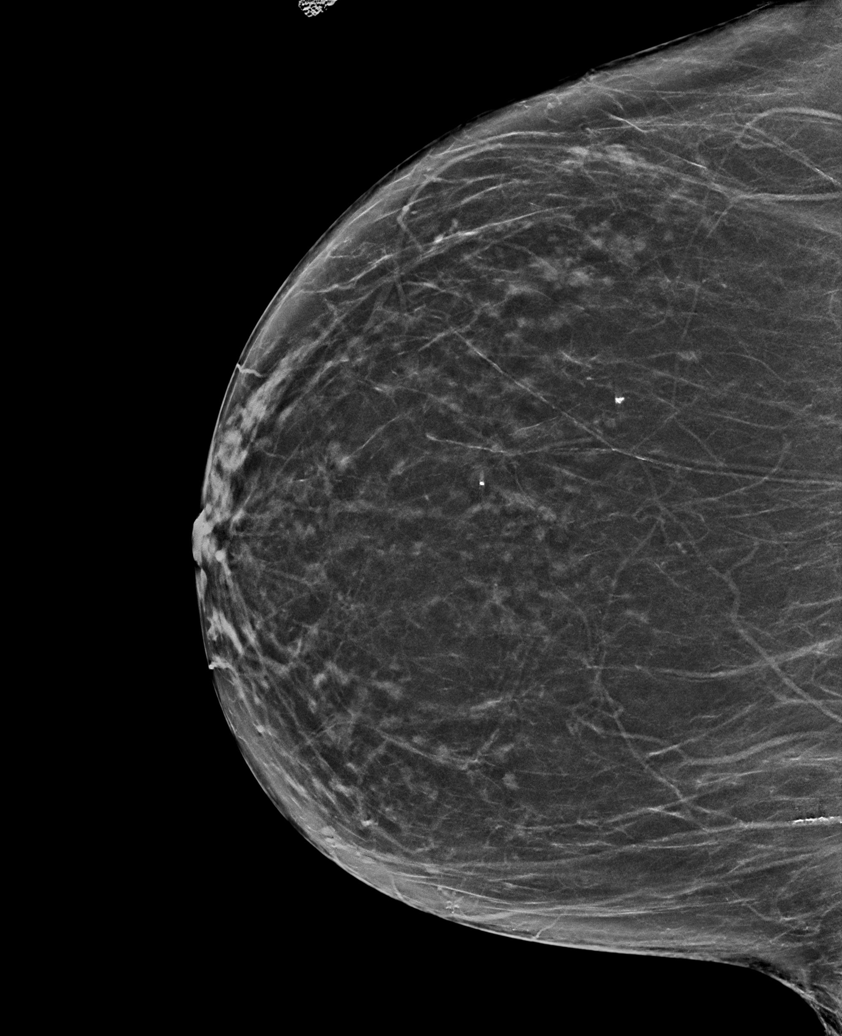

[L MLO synth-2D (2 of 2)]
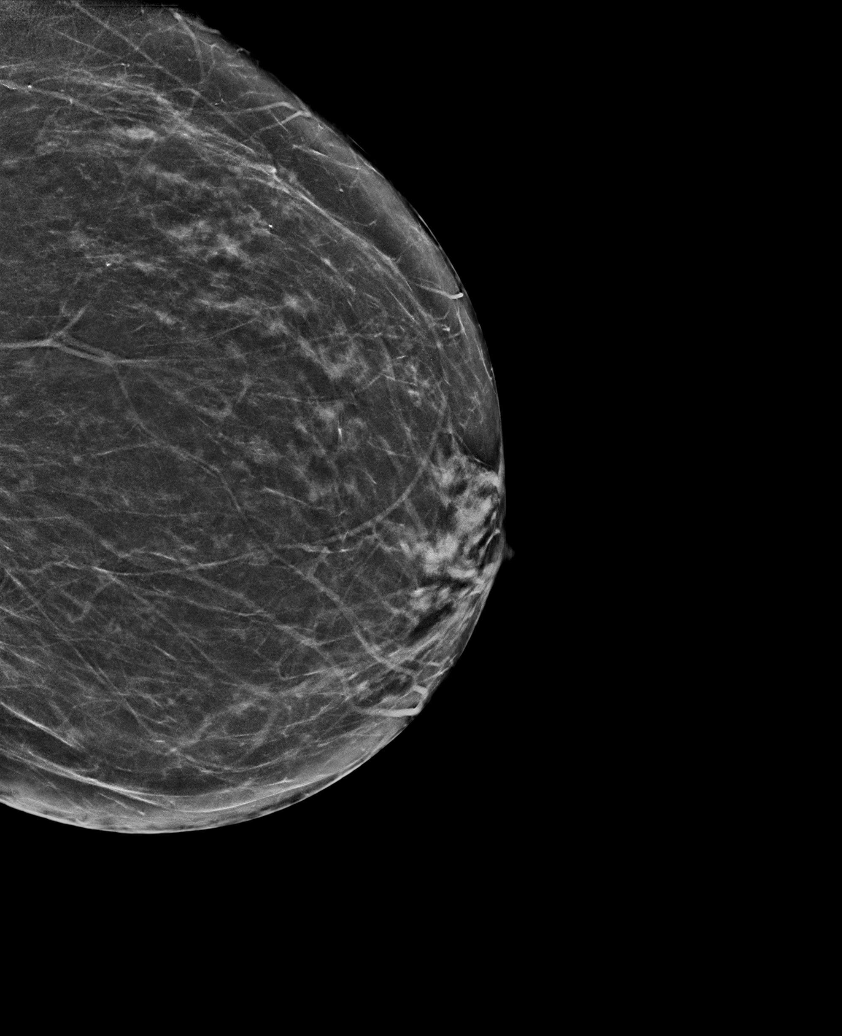

[L CC synth-2D]
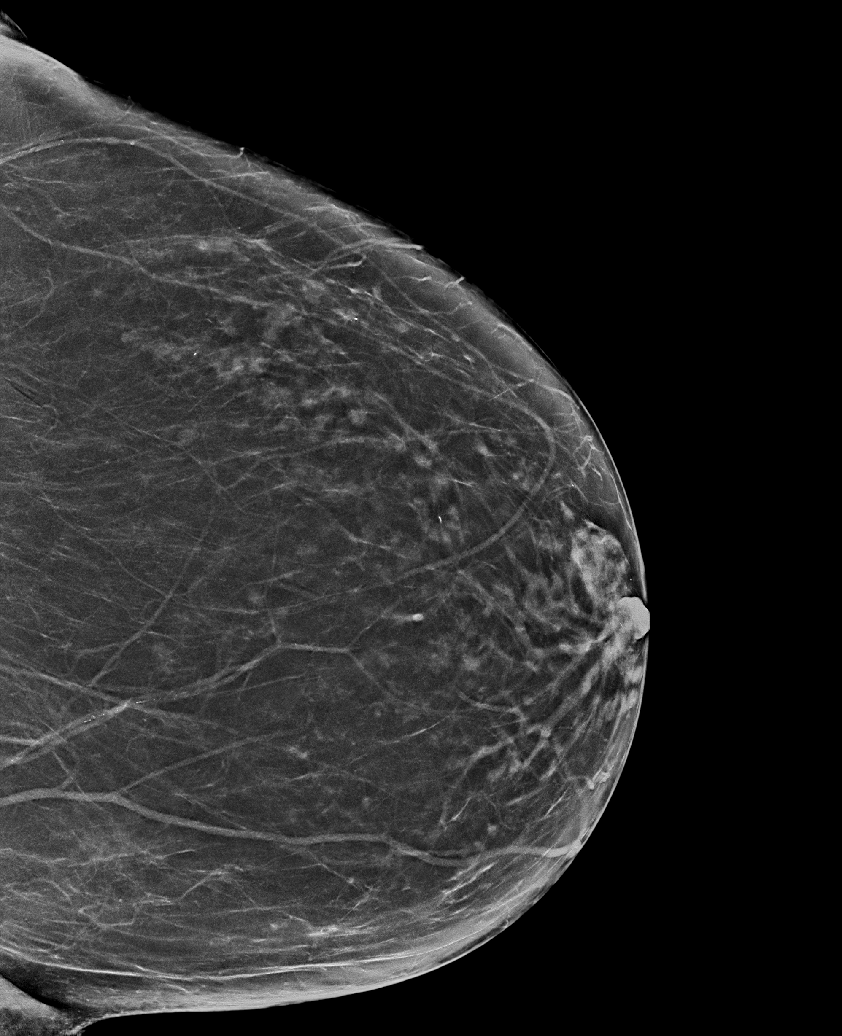

[R MLO tomo · tomo slice 39/78.0]
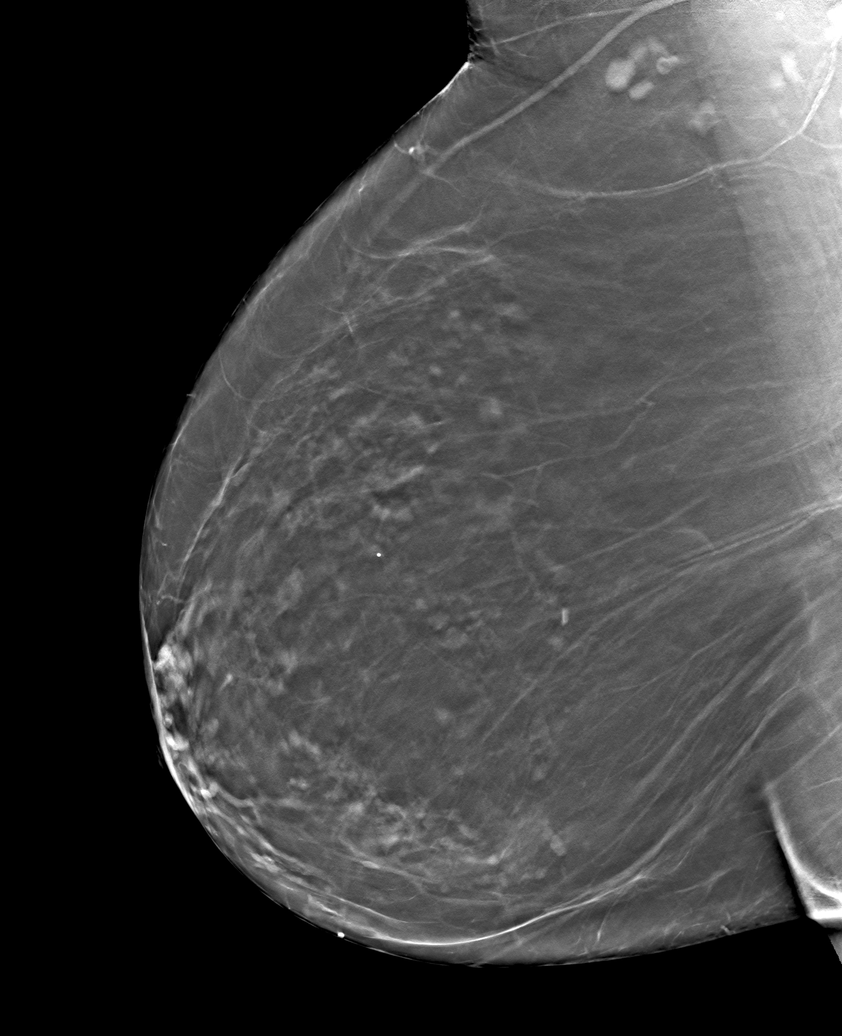

[6 of 30 positions shown; findings below may reference images not displayed]

ACR Breast Density Category b: There are scattered areas of
fibroglandular density.
FINDINGS: There are no findings suspicious for malignancy. The images were
evaluated with computer-aided detection.
IMPRESSION: No mammographic evidence of malignancy. A result letter of this
screening mammogram will be mailed directly to the patient.

RECOMMENDATION:
Screening mammogram in one year. (Code:[id number])

BI-RADS CATEGORY  1: Negative.

## 2022-10-24 ENCOUNTER — Encounter: Payer: Self-pay | Admitting: Internal Medicine

## 2022-10-24 ENCOUNTER — Ambulatory Visit (INDEPENDENT_AMBULATORY_CARE_PROVIDER_SITE_OTHER): Payer: Medicare PPO | Admitting: Internal Medicine

## 2022-10-24 VITALS — BP 125/76 | HR 68 | Ht 67.0 in | Wt 223.0 lb

## 2022-10-24 DIAGNOSIS — Z6834 Body mass index (BMI) 34.0-34.9, adult: Secondary | ICD-10-CM | POA: Diagnosis not present

## 2022-10-24 DIAGNOSIS — I10 Essential (primary) hypertension: Secondary | ICD-10-CM | POA: Diagnosis not present

## 2022-10-24 DIAGNOSIS — E213 Hyperparathyroidism, unspecified: Secondary | ICD-10-CM

## 2022-10-24 NOTE — Assessment & Plan Note (Signed)
Clinically stable exam with well controlled BP on losartan and verapamil. Tolerating medications without side effects. Pt to continue current regimen and low sodium diet.

## 2022-10-24 NOTE — Assessment & Plan Note (Addendum)
Recently seen by Endo Work up negative so far; US neck pending

## 2022-10-24 NOTE — Progress Notes (Signed)
Date:  10/24/2022   Name:  Elizabeth Franco   DOB:  1946/03/17   MRN:  [id number]   Chief Complaint: Hypertension  Hypertension This is a chronic problem. The problem is controlled. Pertinent negatives include no chest pain, headaches, palpitations or shortness of breath. Past treatments include calcium channel blockers and angiotensin blockers.   Elevated Calcium - seen by Endo - workup essentially negative.  Has [place] of Parathyroid glands pending.  She feels well.  Minimal ankle edema off of hctz.  Lab Results  Component Value Date   NA 140 07/02/2022   K 4.3 07/02/2022   CO2 21 07/02/2022   GLUCOSE 97 07/02/2022   BUN 10 07/02/2022   CREATININE 0.72 07/02/2022   CALCIUM 11.3 (H) 07/02/2022   EGFR 86 07/02/2022   Lab Results  Component Value Date   CHOL 168 04/25/2022   HDL 81 04/25/2022   LDLCALC 71 04/25/2022   TRIG 90 04/25/2022   CHOLHDL 2.1 04/25/2022   Lab Results  Component Value Date   TSH 1.020 04/25/2022   No results found for: "HGBA1C" Lab Results  Component Value Date   WBC 4.8 08/26/2021   HGB 13.7 08/26/2021   HCT 40.9 08/26/2021   MCV 90 08/26/2021   PLT 307 08/26/2021   Lab Results  Component Value Date   ALT 12 07/02/2022   AST 12 07/02/2022   ALKPHOS 80 07/02/2022   BILITOT 0.5 07/02/2022   Lab Results  Component Value Date   VD25OH 33.9 04/25/2022     Review of Systems  Constitutional:  Negative for fatigue and unexpected weight change.  HENT:  Negative for nosebleeds.   Eyes:  Negative for visual disturbance.  Respiratory:  Negative for cough, chest tightness, shortness of breath and wheezing.   Cardiovascular:  Negative for chest pain, palpitations and leg swelling.  Gastrointestinal:  Negative for abdominal pain, constipation and diarrhea.  Musculoskeletal:  Negative for gait problem and myalgias.  Neurological:  Negative for dizziness, weakness, light-headedness and headaches.  Psychiatric/Behavioral:  Negative for  hallucinations and sleep disturbance. The patient is not nervous/anxious.     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  Current Meds  Medication Sig   cholecalciferol (VITAMIN D3) 25 MCG (1000 UNIT) tablet Take 1,000 Units by mouth daily.   losartan (COZAAR) 100 MG tablet Take 1 tablet (100 mg total) by mouth daily.   omega-3 acid ethyl esters (LOVAZA) 1 g capsule Take by mouth 2 (two) times daily.   simvastatin (ZOCOR) 10 MG tablet TAKE 1 TABLET EVERY DAY   TURMERIC PO Take 1 capsule by mouth daily. 250mg    verapamil (CALAN-SR) 180 MG CR tablet TAKE 1 TABLET AT BEDTIME       10/24/2022  9:38 AM 07/02/2022   10:43 AM 12/24/2021   11:09 AM 08/26/2021    1:27 PM  GAD 7 : Generalized Anxiety Score  Nervous, Anxious, on Edge 0 0 0 0  Control/stop worrying 0 0 0 0  Worry too much - different things 0 0 0 0  Trouble relaxing 0 0 0 0  Restless 0 0 0 0  Easily annoyed or irritable 0 0 0 0  Afraid - awful might happen 0 0 0 0  Total GAD 7 Score 0 0 0 0  Anxiety Difficulty Not difficult at all Not difficult at all Not difficult at all Not difficult at all       10/24/2022    9:37 AM 09/18/2022   10:37 AM 07/02/2022   10:42 AM  Depression screen PHQ 2/9  Decreased Interest 0 0 0  Down, Depressed, Hopeless 0  0 0  PHQ - 2 Score 0 0 0  Altered sleeping 0 0 0  Tired, decreased energy 0 0 0  Change in appetite 0 0 0  Feeling bad or failure about yourself  0 0 0  Trouble concentrating 0 0 0  Moving slowly or fidgety/restless 0 0 0  Suicidal thoughts 0 0 0  PHQ-9 Score 0 0 0  Difficult doing work/chores Not difficult at all Not difficult at all Not difficult at all    BP Readings from Last 3 Encounters:  10/24/22 125/76  09/18/22 138/78  07/02/22 128/78    Physical Exam Vitals and nursing note reviewed.  Constitutional:      General: She is not in acute distress.    Appearance: Normal appearance. She is well-developed.  HENT:     Head: Normocephalic and atraumatic.  Cardiovascular:     Rate and Rhythm: Normal rate and regular rhythm.     Pulses: Normal pulses.     Heart sounds: No murmur heard. Pulmonary:     Effort: Pulmonary effort is normal. No respiratory distress.     Breath sounds: No wheezing or rhonchi.  Musculoskeletal:     Cervical back: Normal range of motion.     Right lower leg: No edema.     Left lower leg: No edema.  Skin:    General: Skin is warm and dry.     Capillary Refill: Capillary refill takes less than 2 seconds.     Findings: No rash.  Neurological:     General: No focal deficit present.     Mental Status: She is alert and oriented to person, place, and time.  Psychiatric:        Mood and Affect: Mood normal.        Behavior: Behavior normal.     Wt Readings from Last 3 Encounters:  10/24/22 223 lb (101.2 kg)  09/18/22 223 lb 6.4 oz (101.3 kg)  07/02/22 225 lb (102.1 kg)    BP 125/76 (BP Location: Left Arm, Cuff Size: Large)   Pulse 68   Ht 5\' 7"  (1.702 m)   Wt 223 lb (101.2 kg)   SpO2 97%   BMI 34.93 kg/m   Assessment and Plan:  Problem List Items Addressed This Visit       Cardiovascular and Mediastinum   Essential hypertension - Primary (Chronic)    Clinically stable exam with well controlled BP on losartan and  verapamil. Tolerating medications without side effects. Pt to continue current regimen and low sodium diet.       Relevant Medications   omega-3 acid ethyl esters (  LOVAZA) 1 g capsule     Endocrine   Hyperparathyroidism ([place]) (Chronic)    Recently seen by Endo Work up negative so far; US neck pending      Other Visit Diagnoses     BMI 34.0-34.9,adult       recommend low carb, low sodium diet       Return in about 6 months (around 04/26/2023) for CPX.   Partially dictated using [place], any errors are not intentional.  [name], MD [place], [place]

## 2022-10-27 ENCOUNTER — Encounter: Payer: Self-pay | Admitting: Internal Medicine

## 2022-10-31 ENCOUNTER — Other Ambulatory Visit: Payer: Self-pay

## 2022-10-31 ENCOUNTER — Telehealth: Payer: Self-pay | Admitting: Internal Medicine

## 2022-10-31 DIAGNOSIS — I10 Essential (primary) hypertension: Secondary | ICD-10-CM

## 2022-10-31 MED ORDER — LOSARTAN POTASSIUM 100 MG PO TABS: 100.0000 mg | ORAL_TABLET | Freq: Every day | ORAL | 0 refills | Status: DC

## 2022-10-31 NOTE — Telephone Encounter (Signed)
Copied from [place] [id number]. Topic: General - Inquiry >> Oct 31, 2022 11:42 AM Erskine Squibb wrote: Reason for CRM: The patient called in stating she had an appt with her provider last week and she told her provider she would need a refill on her losartan but the patient called the pharmacy and she states she is now completely out of her medication. Please assist patient as soon as possible and she at least needs a 10 day supply because she has absolutely none left.

## 2022-10-31 NOTE — Telephone Encounter (Signed)
Spoke with patient and informed losartan was sent in.  Elizabeth Franco

## 2022-11-19 DIAGNOSIS — E213 Hyperparathyroidism, unspecified: Secondary | ICD-10-CM | POA: Diagnosis not present

## 2022-12-05 ENCOUNTER — Other Ambulatory Visit: Payer: Self-pay | Admitting: Internal Medicine

## 2022-12-31 ENCOUNTER — Ambulatory Visit
Admission: RE | Admit: 2022-12-31 | Discharge: 2022-12-31 | Disposition: A | Discharge status: Home / Self Care | Payer: Medicare PPO | Source: Ambulatory Visit | Attending: Internal Medicine | Admitting: Internal Medicine

## 2022-12-31 DIAGNOSIS — Z1231 Encounter for screening mammogram for malignant neoplasm of breast: Secondary | ICD-10-CM | POA: Diagnosis not present

## 2023-01-05 ENCOUNTER — Encounter: Payer: Self-pay | Admitting: Emergency Medicine

## 2023-01-05 ENCOUNTER — Ambulatory Visit (INDEPENDENT_AMBULATORY_CARE_PROVIDER_SITE_OTHER): Payer: Medicare PPO

## 2023-01-05 ENCOUNTER — Ambulatory Visit
Admission: EM | Admit: 2023-01-05 | Discharge: 2023-01-05 | Disposition: A | Discharge status: Home / Self Care | Payer: Medicare PPO | Attending: Nurse Practitioner | Admitting: Nurse Practitioner

## 2023-01-05 DIAGNOSIS — M79672 Pain in left foot: Secondary | ICD-10-CM

## 2023-01-05 DIAGNOSIS — S96912A Strain of unspecified muscle and tendon at ankle and foot level, left foot, initial encounter: Secondary | ICD-10-CM

## 2023-01-05 DIAGNOSIS — M7989 Other specified soft tissue disorders: Secondary | ICD-10-CM | POA: Diagnosis not present

## 2023-01-05 MED ORDER — IBUPROFEN 600 MG PO TABS: 600.0000 mg | ORAL_TABLET | Freq: Two times a day (BID) | ORAL | 0 refills | Status: AC | PRN

## 2023-01-05 NOTE — ED Provider Notes (Signed)
[facility]    CSN: [id number] Arrival date & time: 01/05/23  7829      History   Chief Complaint Chief Complaint  Patient presents with   Foot Pain    HPI Elizabeth Franco is a 77 y.o. female presents for evaluation of foot pain.  Patient reports 1 week of left foot pain and swelling.  States the swelling waxes and wanes based on her activity.  Pain and swelling is located to the dorsum of the foot.  Denies any known injury but does states she went to a couple of parks walking with her grandkids prior to onset.  No numbness or tingling.  No redness or warmth.  No history of left foot injuries or surgeries in the past.  She has used like an over-the-counter arthritis patch with some improvement.  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      Home Medications    Prior to Admission medications   Medication Sig Start Date End Date Taking? Authorizing Provider  ibuprofen (ADVIL) 600 MG tablet Take 1 tablet (600 mg total) by mouth 2 (two) times daily as needed (foot pain). 01/05/23  Yes Radford Pax, NP  cholecalciferol (VITAMIN D3) 25 MCG (1000 UNIT) tablet Take 1,000 Units by mouth daily.    [provider]  losartan (COZAAR) 100 MG tablet Take 1 tablet (100 mg total) by mouth daily. 10/31/22   Reubin Milan, MD  omega-3 acid ethyl esters (LOVAZA) 1 g capsule Take by mouth 2  (two) times daily.    [provider]  simvastatin (ZOCOR) 10 MG tablet TAKE 1 TABLET EVERY DAY 09/21/22   Reubin Milan, MD  TURMERIC PO Take 1 capsule by mouth daily. 250mg     [provider]  verapamil (CALAN-SR) 180 MG CR tablet TAKE 1 TABLET AT BEDTIME 12/05/22   Reubin Milan, MD    Family History Family History  Problem Relation Age of Onset   Hypertension Mother    Breast cancer Mother 57   Breast cancer Maternal Aunt    Diabetes Maternal Grandmother    Breast cancer Maternal Grandmother 14    Social History Social History   Tobacco Use   Smoking status: Never   Smokeless tobacco: Never  Vaping Use   Vaping Use: Never used  Substance Use Topics   Alcohol use: No   Drug use: Never     Allergies   Patient has no known allergies.   Review of Systems Review of Systems  Musculoskeletal:        Left foot pain      Physical Exam Triage Vital Signs ED Triage Vitals [01/05/23 0857]  Enc Vitals Group     BP (!) 152/85     Pulse Rate 61     Resp 16  Temp 97.8 F (36.6 C)     Temp Source Oral     SpO2 94 %     Weight      Height      Head Circumference      Peak Flow      Pain Score      Pain Loc      Pain Edu?      Excl. in GC?    No data found.  Updated Vital Signs BP (!) 152/85 (BP Location: Left Arm)   Pulse 61   Temp 97.8 F (36.6 C) (Oral)   Resp 16   SpO2 94%   Visual Acuity Right Eye Distance:   Left Eye Distance:   Bilateral Distance:    Right Eye Near:   Left Eye Near:    Bilateral Near:     Physical Exam Vitals and nursing note reviewed.  Constitutional:      General: She is not in acute distress.    Appearance: Normal appearance. She is not ill-appearing.  HENT:     Head: Normocephalic and atraumatic.  Eyes:     Pupils: Pupils are equal, round, and reactive to light.  Cardiovascular:     Rate and Rhythm: Normal rate.  Pulmonary:     Effort: Pulmonary effort is normal.  Musculoskeletal:        Feet:  Feet:     Comments: Minimal nonpitting swelling of the dorsum of the left foot with tender to palpation from proximal foot to base of all toes.  No tenderness with palpation to ankle.  No erythema or warmth.  Cap refill +2 in all toes.  Pain on active dorsiflexion and extension of the foot. Skin:    General: Skin is warm and dry.  Neurological:     General: No focal deficit present.     Mental Status: She is alert and oriented to person, place, and time.  Psychiatric:        Mood and Affect: Mood normal.        Behavior: Behavior normal.      UC Treatments / Results  Labs (all labs ordered are listed, but only abnormal results are displayed) Labs Reviewed - No data to display  Comprehensive Metabolic Panel (CMP) Order: [id number] Component Ref Range & Units 3 mo ago  Glucose 70 - 110 mg/dL 83  Sodium 308 - 657 mmol/L 140  Potassium 3.6 - 5.1 mmol/L 4.3  Chloride 97 - 109 mmol/L 107  Carbon Dioxide (CO2) 22.0 - 32.0 mmol/L 28.3  Urea Nitrogen (BUN) 7 - 25 mg/dL 9  Creatinine 0.6 - 1.1 mg/dL 0.7  Glomerular Filtration Rate (eGFR) >60 mL/min/1.73sq m 89  Comment: CKD-EPI (2021) does not include patient's race in the calculation of eGFR.  Monitoring changes of plasma creatinine and eGFR over time is useful for monitoring kidney function.  Interpretive Ranges for eGFR (CKD-EPI 2021):  eGFR:       >60 mL/min/1.73 sq. m - Normal eGFR:       30-59 mL/min/1.73 sq. m - Moderately Decreased eGFR:       15-29 mL/min/1.73 sq. m  - Severely Decreased eGFR:       < 15 mL/min/1.73 sq. m  - Kidney Failure   Note: These eGFR calculations do not apply in acute situations when eGFR is changing rapidly or patients on dialysis.  Calcium 8.7 - 10.3 mg/dL 84.6 High   AST 8 - 39 U/L 15  ALT 5 - 38 U/L 15  Alk Phos (alkaline Phosphatase) 34 - 104 U/L 97  Albumin 3.5 - 4.8 g/dL 4.1  Bilirubin, Total 0.3 - 1.2 mg/dL 0.4  Protein, Total 6.1 - 7.9 g/dL 6.6  A/G Ratio 1.0 -  5.0 gm/dL 1.6  Resulting Agency [facility]   Specimen Collected: 10/07/22 14:19   Performed by: [facility] Last Resulted: 10/08/22 13:06  Received From: [facility]  Result Received: 10/24/22 07:43   EKG   Radiology DG Foot Complete Left  Result Date: 01/05/2023 CLINICAL DATA:  Swelling and pain for a week. EXAM: LEFT FOOT - COMPLETE 3+ VIEW COMPARISON:  None Available. FINDINGS: Negative for fracture or dislocation in left foot. Mild degenerative changes at the first MTP joint. Calcaneal spurring at the Achilles tendon insertion site. No focal soft tissue abnormality. IMPRESSION: 1. No acute bone abnormality in left foot. Electronically Signed   By: Richarda Overlie M.D.   On: 01/05/2023 09:43    Procedures Procedures (including critical care time)  Medications Ordered in UC Medications - No data to display  Initial Impression / Assessment and Plan / UC Course  I have reviewed the triage vital signs and the nursing notes.  Pertinent labs & imaging results that were available during my care of the patient were reviewed by me and considered in my medical decision making (see chart for details).     Reviewed exam and x-ray with patient.  X-ray negative for fracture.  Discussed foot strain RICE therapy and Ace wrap applied in clinic Ibuprofen twice daily as needed PCP follow-up if symptoms do not improve ER precautions reviewed and patient verbalized understanding Final Clinical Impressions(s) / UC Diagnoses   Final diagnoses:  Left foot pain  Strain of left foot, initial encounter     Discharge Instructions      The x-ray of your left foot was negative for fracture.  Your exam and symptoms are consistent with a foot strain.  Ace wrap was applied your foot while you are in the clinic.  This will help with swelling and pain. Elevate, ice to the foot as needed You may take ibuprofen twice daily as needed.  Take this with food Please  follow-up with your PCP if your symptoms do not improve Please go to the emergency room if you develop any worsening symptoms     ED Prescriptions     Medication Sig Dispense Auth. Provider   ibuprofen (ADVIL) 600 MG tablet Take 1 tablet (600 mg total) by mouth 2 (two) times daily as needed (foot pain). 15 tablet Radford Pax, NP      PDMP not reviewed this encounter.   Radford Pax, NP 01/05/23 [phone]

## 2023-01-05 NOTE — ED Triage Notes (Signed)
Pt presents with left foot pain and swelling x 1 week. Pt denies any injury.

## 2023-01-05 NOTE — Discharge Instructions (Addendum)
The x-ray of your left foot was negative for fracture.  Your exam and symptoms are consistent with a foot strain.  Ace wrap was applied your foot while you are in the clinic.  This will help with swelling and pain. Elevate, ice to the foot as needed You may take ibuprofen twice daily as needed.  Take this with food Please follow-up with your PCP if your symptoms do not improve Please go to the emergency room if you develop any worsening symptoms

## 2023-02-03 DIAGNOSIS — M810 Age-related osteoporosis without current pathological fracture: Secondary | ICD-10-CM | POA: Diagnosis not present

## 2023-04-28 ENCOUNTER — Other Ambulatory Visit: Payer: Self-pay | Admitting: Internal Medicine

## 2023-04-29 ENCOUNTER — Encounter: Payer: Self-pay | Admitting: Internal Medicine

## 2023-04-29 ENCOUNTER — Ambulatory Visit (INDEPENDENT_AMBULATORY_CARE_PROVIDER_SITE_OTHER): Payer: Medicare PPO | Admitting: Internal Medicine

## 2023-04-29 VITALS — BP 126/78 | HR 79 | Ht 67.0 in | Wt 222.0 lb

## 2023-04-29 DIAGNOSIS — Z0000 Encounter for general adult medical examination without abnormal findings: Secondary | ICD-10-CM | POA: Diagnosis not present

## 2023-04-29 DIAGNOSIS — E7800 Pure hypercholesterolemia, unspecified: Secondary | ICD-10-CM | POA: Diagnosis not present

## 2023-04-29 DIAGNOSIS — R011 Cardiac murmur, unspecified: Secondary | ICD-10-CM

## 2023-04-29 DIAGNOSIS — E213 Hyperparathyroidism, unspecified: Secondary | ICD-10-CM | POA: Diagnosis not present

## 2023-04-29 DIAGNOSIS — M810 Age-related osteoporosis without current pathological fracture: Secondary | ICD-10-CM

## 2023-04-29 DIAGNOSIS — Z23 Encounter for immunization: Secondary | ICD-10-CM

## 2023-04-29 DIAGNOSIS — E538 Deficiency of other specified B group vitamins: Secondary | ICD-10-CM | POA: Diagnosis not present

## 2023-04-29 DIAGNOSIS — I10 Essential (primary) hypertension: Secondary | ICD-10-CM

## 2023-04-29 LAB — POCT URINALYSIS DIPSTICK
Bilirubin, UA: NEGATIVE
Blood, UA: NEGATIVE
Glucose, UA: NEGATIVE
Ketones, UA: NEGATIVE
Leukocytes, UA: NEGATIVE
Nitrite, UA: NEGATIVE
Protein, UA: NEGATIVE
Spec Grav, UA: 1.015 (ref 1.010–1.025)
Urobilinogen, UA: 0.2 E.U./dL
pH, UA: 6.5 (ref 5.0–8.0)

## 2023-04-29 MED ORDER — LOSARTAN POTASSIUM 100 MG PO TABS
100.0000 mg | ORAL_TABLET | Freq: Every day | ORAL | 3 refills | Status: DC
Start: 2023-04-29 — End: 2023-09-15

## 2023-04-29 NOTE — Assessment & Plan Note (Signed)
Normal exam with stable BP on losartan and verapamil. No concerns or side effects to current medication. No change in regimen; continue low sodium diet.

## 2023-04-29 NOTE — Assessment & Plan Note (Addendum)
Last DEXA 2023 On Calcium, vitamin D and bisphosphonate started this year.

## 2023-04-29 NOTE — Assessment & Plan Note (Addendum)
Followed by Endo; she may decide to come here for refills due to high specialist copay Suspect primary Hyperpara Will check vitamin D and renal function

## 2023-04-29 NOTE — Assessment & Plan Note (Signed)
LDL is  Lab Results  Component Value Date   LDLCALC 71 04/25/2022   Currently being treated with simvastatin with good compliance and no concerns.

## 2023-04-29 NOTE — Assessment & Plan Note (Signed)
On oral supplement daily Last B12 normal/high

## 2023-04-29 NOTE — Progress Notes (Signed)
Date:  04/29/2023   Name:  Elizabeth Franco   DOB:  [birth year]   MRN:  [id number]   Chief Complaint: Annual Exam Elizabeth Franco is a 77 y.o. female who presents today for her Complete Annual Exam. She feels well. She reports exercising/ walking. She reports she is sleeping well. Breast complaints - none.  Mammogram: 12/2022 DEXA: 10/2021 osteoporosis Colonoscopy: 2012  Health Maintenance Due  Topic Date Due   Zoster Vaccines- Shingrix (1 of 2) Never done   INFLUENZA VACCINE  03/05/2023   COVID-19 Vaccine (5 - 2023-24 season) 04/05/2023    Immunization History  Administered Date(s) Administered   Fluad Quad(high Dose 65+) 05/03/2019, 04/24/2021, 04/25/2022   Influenza, High Dose Seasonal PF 05/04/2018   Influenza-Unspecified 06/16/2015, 06/04/2016, 05/11/2020   Moderna Sars-Covid-2 Vaccination 08/21/2019, 09/18/2019, 06/06/2020, 01/30/2021   PNEUMOCOCCAL CONJUGATE-20 04/25/2022   Pneumococcal Conjugate-13 08/01/2016   Pneumococcal Polysaccharide-23 11/09/2012   Tdap 01/09/2015    Hypertension This is a chronic problem. The problem is controlled. Pertinent negatives include no chest pain, headaches, palpitations or shortness of breath. Past treatments include angiotensin blockers and calcium channel blockers.  Hyperlipidemia This is a chronic problem. The problem is controlled. Pertinent negatives include no chest pain or shortness of breath. Current antihyperlipidemic treatment includes statins.    Lab Results  Component Value Date   NA 140 07/02/2022   K 4.3 07/02/2022   CO2 21 07/02/2022   GLUCOSE 97 07/02/2022   BUN 10 07/02/2022   CREATININE 0.72 07/02/2022   CALCIUM 11.3 (H) 07/02/2022   EGFR 86 07/02/2022   Lab Results  Component Value Date   CHOL 168 04/25/2022   HDL 81 04/25/2022   LDLCALC 71 04/25/2022   TRIG 90 04/25/2022   CHOLHDL 2.1 04/25/2022   Lab Results  Component Value Date   TSH 1.020 04/25/2022   No results found for: "HGBA1C" Lab  Results  Component Value Date   WBC 4.8 08/26/2021   HGB 13.7 08/26/2021   HCT 40.9 08/26/2021   MCV 90 08/26/2021   PLT 307 08/26/2021   Lab Results  Component Value Date   ALT 12 07/02/2022   AST 12 07/02/2022   ALKPHOS 80 07/02/2022   BILITOT 0.5 07/02/2022   Lab Results  Component Value Date   VD25OH 33.9 04/25/2022    Lab Results  Component Value Date   VITAMINB12 1,500 08/15/2020     Review of Systems  Constitutional:  Negative for chills, fatigue and fever.  HENT:  Negative for congestion, hearing loss, tinnitus, trouble swallowing and voice change.   Eyes:  Negative for visual disturbance.  Respiratory:  Negative for cough, chest tightness, shortness of breath and wheezing.   Cardiovascular:  Negative for chest pain, palpitations and leg swelling.  Gastrointestinal:  Negative for abdominal pain, constipation, diarrhea and vomiting.  Endocrine: Negative for polydipsia and polyuria.  Genitourinary:  Negative for dysuria, frequency, genital sores, vaginal bleeding and vaginal discharge.  Musculoskeletal:  Negative for arthralgias, gait problem and joint swelling.  Skin:  Negative for color change and rash.  Neurological:  Negative for dizziness, tremors, light-headedness and headaches.  Hematological:  Negative for adenopathy. Does not bruise/bleed easily.  Psychiatric/Behavioral:  Negative for dysphoric mood and sleep disturbance. The patient is not nervous/anxious.     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  Current Meds  Medication Sig   cholecalciferol (VITAMIN D3) 25 MCG (1000 UNIT) tablet Take 1,000 Units by mouth daily.   ibuprofen (ADVIL) 600 MG tablet Take 1 tablet (600 mg total) by mouth 2 (two) times daily as needed (foot pain).   omega-3 acid ethyl esters (LOVAZA) 1 g capsule Take by mouth 2 (two) times daily.   risedronate (ACTONEL) 150 MG tablet Take 150 mg by mouth every 30 (thirty) days.   simvastatin (ZOCOR) 10 MG tablet TAKE 1 TABLET EVERY DAY   TURMERIC PO Take 1 capsule by mouth daily. 250mg    verapamil (CALAN-SR) 180 MG CR tablet TAKE 1 TABLET AT BEDTIME   [DISCONTINUED] losartan (COZAAR) 100 MG tablet Take 1 tablet (100 mg total) by mouth daily.       04/29/2023    8:57 AM 10/24/2022    9:38 AM 07/02/2022   10:43 AM 12/24/2021   11:09 AM  GAD 7 : Generalized Anxiety Score  Nervous, Anxious, on Edge 0 0 0 0  Control/stop worrying 0 0 0 0  Worry too much - different things 0 0 0 0  Trouble relaxing 0 0 0 0  Restless 0 0 0 0  Easily annoyed or irritable 0 0 0 0  Afraid - awful might happen 0 0 0 0  Total GAD 7 Score 0 0 0 0  Anxiety Difficulty Not difficult at all Not difficult at all Not difficult at all Not difficult at all       04/29/2023    8:57 AM 10/24/2022    9:37 AM 09/18/2022   10:37 AM  Depression screen PHQ 2/9  Decreased Interest 0 0 0  Down, Depressed, Hopeless 0 0 0  PHQ - 2 Score 0 0 0  Altered sleeping 0 0 0  Tired, decreased energy 0 0 0  Change in appetite 0 0 0  Feeling bad or failure about yourself  0 0 0  Trouble concentrating 0 0 0  Moving slowly or fidgety/restless 0 0 0  Suicidal thoughts 0 0 0  PHQ-9 Score 0 0 0  Difficult doing  work/chores Not difficult at all Not difficult at all Not difficult at all    BP Readings from Last 3 Encounters:  04/29/23 126/78  01/05/23 (!) 152/85  10/24/22 125/76    Physical Exam Vitals and nursing note reviewed.  Constitutional:      General: She is not in acute distress.    Appearance: She is well-developed.  HENT:     Head: Normocephalic and atraumatic.     Right Ear: Tympanic membrane and ear canal normal.     Left Ear: Tympanic membrane and ear canal normal.     Nose:     Right Sinus: No maxillary sinus tenderness.     Left Sinus: No maxillary sinus tenderness.  Eyes:  General: No scleral icterus.       Right eye: No discharge.        Left eye: No discharge.     Conjunctiva/sclera: Conjunctivae normal.  Neck:     Thyroid: No thyromegaly.     Vascular: No carotid bruit.  Cardiovascular:     Rate and Rhythm: Normal rate and regular rhythm. No extrasystoles are present.    Pulses: Normal pulses.     Heart sounds: Murmur heard.     Systolic murmur is present with a grade of 2/6.  Pulmonary:     Effort: Pulmonary effort is normal. No respiratory distress.     Breath sounds: No wheezing.  Chest:  Breasts:    Right: No mass, nipple discharge, skin change or tenderness.     Left: No mass, nipple discharge, skin change or tenderness.  Abdominal:     General: Bowel sounds are normal.     Palpations: Abdomen is soft.     Tenderness: There is no abdominal tenderness.  Musculoskeletal:     Cervical back: Normal range of motion. No erythema.     Right lower leg: No edema.     Left lower leg: Edema present.  Lymphadenopathy:     Cervical: No cervical adenopathy.  Skin:    General: Skin is warm and dry.     Findings: No rash.  Neurological:     Mental Status: She is alert and oriented to person, place, and time.     Cranial Nerves: No cranial nerve deficit.     Sensory: No sensory deficit.     Deep Tendon Reflexes: Reflexes are normal and symmetric.   Psychiatric:        Attention and Perception: Attention normal.        Mood and Affect: Mood normal.    Lab Results  Component Value Date   COLORU yellow 04/29/2023   CLARITYU clear 04/29/2023   GLUCOSEUR Negative 04/29/2023   BILIRUBINUR neg 04/29/2023   KETONESU neg 04/29/2023   SPECGRAV 1.015 04/29/2023   RBCUR neg 04/29/2023   PHUR 6.5 04/29/2023   PROTEINUR Negative 04/29/2023   UROBILINOGEN 0.2 04/29/2023   LEUKOCYTESUR Negative 04/29/2023     Wt Readings from Last 3 Encounters:  04/29/23 222 lb (100.7 kg)  10/24/22 223 lb (101.2 kg)  09/18/22 223 lb 6.4 oz (101.3 kg)    BP 126/78 (BP Location: Right Arm, Cuff Size: Large)   Pulse 79   Ht 5\' 7"  (1.702 m)   Wt 222 lb (100.7 kg)   SpO2 95%   BMI 34.77 kg/m   Assessment and Plan:  Problem List Items Addressed This Visit       Unprioritized   Hyperparathyroidism (HCC) (Chronic)    Followed by Endo; she may decide to come here for refills due to high specialist copay Suspect primary Hyperpara Will check vitamin D and renal function      Relevant Orders   Comprehensive metabolic panel   VITAMIN D 25 Hydroxy (Vit-D Deficiency, Fractures)   Hypercholesterolemia (Chronic)    LDL is  Lab Results  Component Value Date   LDLCALC 71 04/25/2022   Currently being treated with simvastatin with good compliance and no concerns.       Relevant Medications   losartan (COZAAR) 100 MG tablet   Other Relevant Orders   Lipid panel   Essential hypertension (Chronic)    Normal exam with stable BP on losartan and verapamil. No concerns or side effects to current medication. No  change in regimen; continue low sodium diet.       Relevant Medications   losartan (COZAAR) 100 MG tablet   Other Relevant Orders   CBC with Differential/Platelet   Comprehensive metabolic panel   TSH   POCT urinalysis dipstick   B12 deficiency (Chronic)    On oral supplement daily Last B12 normal/high      Relevant Orders    Vitamin B12   Age-related osteoporosis without current pathological fracture    Last DEXA 2023 On Calcium, vitamin D and bisphosphonate started this year.      Relevant Orders   VITAMIN D 25 Hydroxy (Vit-D Deficiency, Fractures)   Other Visit Diagnoses     Annual physical exam    -  Primary   Newly recognized heart murmur       Relevant Orders   ECHOCARDIOGRAM COMPLETE   Need for influenza vaccination       Relevant Orders   Flu Vaccine Trivalent High Dose (Fluad)       Return in about 6 months (around 10/27/2023) for HTN.    Reubin Milan, MD [facility] [place]

## 2023-04-30 LAB — CBC WITH DIFFERENTIAL/PLATELET
Basophils Absolute: 0.1 10*3/uL (ref 0.0–0.2)
Basos: 1 %
EOS (ABSOLUTE): 0.2 10*3/uL (ref 0.0–0.4)
Eos: 4 %
Hematocrit: 38.4 % (ref 34.0–46.6)
Hemoglobin: 12.2 g/dL (ref 11.1–15.9)
Immature Grans (Abs): 0 10*3/uL (ref 0.0–0.1)
Immature Granulocytes: 1 %
Lymphocytes Absolute: 1.4 10*3/uL (ref 0.7–3.1)
Lymphs: 27 %
MCH: 29.4 pg (ref 26.6–33.0)
MCHC: 31.8 g/dL (ref 31.5–35.7)
MCV: 93 fL (ref 79–97)
Monocytes Absolute: 0.7 10*3/uL (ref 0.1–0.9)
Monocytes: 14 %
Neutrophils Absolute: 2.7 10*3/uL (ref 1.4–7.0)
Neutrophils: 53 %
Platelets: 324 10*3/uL (ref 150–450)
RBC: 4.15 x10E6/uL (ref 3.77–5.28)
RDW: 14 % (ref 11.7–15.4)
WBC: 5 10*3/uL (ref 3.4–10.8)

## 2023-04-30 LAB — COMPREHENSIVE METABOLIC PANEL
ALT: 14 IU/L (ref 0–32)
AST: 15 IU/L (ref 0–40)
Albumin: 4.3 g/dL (ref 3.8–4.8)
Alkaline Phosphatase: 84 IU/L (ref 44–121)
BUN/Creatinine Ratio: 13 (ref 12–28)
BUN: 7 mg/dL — ABNORMAL LOW (ref 8–27)
Bilirubin Total: 0.4 mg/dL (ref 0.0–1.2)
CO2: 19 mmol/L — ABNORMAL LOW (ref 20–29)
Calcium: 10.2 mg/dL (ref 8.7–10.3)
Chloride: 105 mmol/L (ref 96–106)
Creatinine, Ser: 0.56 mg/dL — ABNORMAL LOW (ref 0.57–1.00)
Globulin, Total: 2.4 g/dL (ref 1.5–4.5)
Glucose: 81 mg/dL (ref 70–99)
Potassium: 4 mmol/L (ref 3.5–5.2)
Sodium: 140 mmol/L (ref 134–144)
Total Protein: 6.7 g/dL (ref 6.0–8.5)
eGFR: 94 mL/min/{1.73_m2} (ref >59)

## 2023-04-30 LAB — LIPID PANEL
Chol/HDL Ratio: 2.5 ratio (ref 0.0–4.4)
Cholesterol, Total: 173 mg/dL (ref 100–199)
HDL: 70 mg/dL (ref >39)
LDL Chol Calc (NIH): 87 mg/dL (ref 0–99)
Triglycerides: 90 mg/dL (ref 0–149)
VLDL Cholesterol Cal: 16 mg/dL (ref 5–40)

## 2023-04-30 LAB — VITAMIN B12: Vitamin B-12: 1268 pg/mL — ABNORMAL HIGH (ref 232–1245)

## 2023-04-30 LAB — VITAMIN D 25 HYDROXY (VIT D DEFICIENCY, FRACTURES): Vit D, 25-Hydroxy: 31 ng/mL (ref 30.0–100.0)

## 2023-04-30 LAB — TSH: TSH: 1.35 u[IU]/mL (ref 0.450–4.500)

## 2023-05-11 ENCOUNTER — Other Ambulatory Visit: Payer: Self-pay

## 2023-05-11 ENCOUNTER — Telehealth: Payer: Self-pay | Admitting: Internal Medicine

## 2023-05-11 DIAGNOSIS — R011 Cardiac murmur, unspecified: Secondary | ICD-10-CM

## 2023-05-11 NOTE — Telephone Encounter (Signed)
Pt is calling in because she is scheduled for an MRI and says her insurance isn't covering it and she needs to cancel the appointment and go through somewhere else. Please follow up with pt.

## 2023-05-11 NOTE — Telephone Encounter (Signed)
Patient said her insurance charges $200 copayment for ECHO and she cannot afford that. Will refer patient to cardiology at [facility] in [facility] to discuss new found heart murmur.   - Darian Cansler

## 2023-05-26 DIAGNOSIS — R011 Cardiac murmur, unspecified: Secondary | ICD-10-CM | POA: Diagnosis not present

## 2023-05-26 DIAGNOSIS — I10 Essential (primary) hypertension: Secondary | ICD-10-CM | POA: Diagnosis not present

## 2023-05-26 DIAGNOSIS — E7800 Pure hypercholesterolemia, unspecified: Secondary | ICD-10-CM | POA: Diagnosis not present

## 2023-05-27 ENCOUNTER — Ambulatory Visit: Payer: Medicare PPO

## 2023-05-28 DIAGNOSIS — R011 Cardiac murmur, unspecified: Secondary | ICD-10-CM | POA: Diagnosis not present

## 2023-06-24 ENCOUNTER — Telehealth: Payer: Self-pay | Admitting: Internal Medicine

## 2023-06-24 NOTE — Telephone Encounter (Signed)
Spoke to pt she seen a cardiologist in [place] at [facility].  KP

## 2023-06-24 NOTE — Telephone Encounter (Signed)
Please contact patient or find out why this has not been done.

## 2023-07-11 ENCOUNTER — Other Ambulatory Visit: Payer: Self-pay | Admitting: Internal Medicine

## 2023-07-11 DIAGNOSIS — E7800 Pure hypercholesterolemia, unspecified: Secondary | ICD-10-CM

## 2023-07-13 NOTE — Telephone Encounter (Signed)
Requested Prescriptions  Pending Prescriptions Disp Refills   simvastatin (ZOCOR) 10 MG tablet [Pharmacy Med Name: Simvastatin Oral Tablet 10 MG] 90 tablet 2    Sig: TAKE 1 TABLET EVERY DAY     Cardiovascular:  Antilipid - Statins Failed - 07/11/2023  3:00 AM      Failed - Lipid Panel in normal range within the last 12 months    Cholesterol, Total  Date Value Ref Range Status  04/29/2023 173 100 - 199 mg/dL Final   LDL Chol Calc (NIH)  Date Value Ref Range Status  04/29/2023 87 0 - 99 mg/dL Final   HDL  Date Value Ref Range Status  04/29/2023 70 >39 mg/dL Final   Triglycerides  Date Value Ref Range Status  04/29/2023 90 0 - 149 mg/dL Final         Passed - Patient is not pregnant      Passed - Valid encounter within last 12 months    Recent Outpatient Visits           2 months ago Annual physical exam   [place] [facility] at [facility], Nyoka Cowden, MD   8 months ago Essential hypertension   [place] [facility] at [facility], Nyoka Cowden, MD   1 year ago Essential hypertension   [place] [facility] at [facility], Nyoka Cowden, MD   1 year ago Annual physical exam   [facility] at [facility], Nyoka Cowden, MD   1 year ago Essential hypertension    Primary Care & Sports Medicine at [facility], Nyoka Cowden, MD       Future Appointments             In 2 months Judithann Graves, Nyoka Cowden, MD [facility] at [facility], [facility]

## 2023-09-07 DIAGNOSIS — E213 Hyperparathyroidism, unspecified: Secondary | ICD-10-CM | POA: Diagnosis not present

## 2023-09-07 DIAGNOSIS — M810 Age-related osteoporosis without current pathological fracture: Secondary | ICD-10-CM | POA: Diagnosis not present

## 2023-09-15 ENCOUNTER — Other Ambulatory Visit: Payer: Self-pay | Admitting: Internal Medicine

## 2023-09-15 DIAGNOSIS — I10 Essential (primary) hypertension: Secondary | ICD-10-CM

## 2023-09-15 NOTE — Telephone Encounter (Signed)
Medication Refill -  Most Recent Primary Care Visit:  Provider: Reubin Milan  Department: [facility] [place]  Visit Type: PHYSICAL  Date: 04/29/2023  Medication: losartan (COZAAR) 100 MG tablet   Has the patient contacted their pharmacy? No Pt requests that the Rx be sent to a different pharmacy  Is this the correct pharmacy for this prescription? Yes If no, delete pharmacy and type the correct one.  This is the patient's preferred pharmacy: [id number], [place] - [address] AT [facility] [address] [place] [address] [place] [address] [place] [address] Phone: [phone] Fax: [phone]  Has the prescription been filled recently? No  Is the patient out of the medication? No  Has the patient been seen for an appointment in the last year OR does the patient have an upcoming appointment? Yes  Can we respond through MyChart? No  Agent: Please be advised that Rx refills may take up to 3 business days. We ask that you follow-up with your pharmacy.

## 2023-09-16 DIAGNOSIS — M810 Age-related osteoporosis without current pathological fracture: Secondary | ICD-10-CM | POA: Diagnosis not present

## 2023-09-16 MED ORDER — LOSARTAN POTASSIUM 100 MG PO TABS: 100.0000 mg | ORAL_TABLET | Freq: Every day | ORAL | 0 refills | Status: DC

## 2023-09-16 NOTE — Telephone Encounter (Signed)
Sending to [facility].  Requested Prescriptions  Pending Prescriptions Disp Refills   losartan (COZAAR) 100 MG tablet 90 tablet 0    Sig: Take 1 tablet (100 mg total) by mouth daily.     Cardiovascular:  Angiotensin Receptor Blockers Failed - 09/16/2023  8:41 AM      Failed - Cr in normal range and within 180 days    Creatinine, Ser  Date Value Ref Range Status  04/29/2023 0.56 (L) 0.57 - 1.00 mg/dL Final         Passed - K in normal range and within 180 days    Potassium  Date Value Ref Range Status  04/29/2023 4.0 3.5 - 5.2 mmol/L Final         Passed - Patient is not pregnant      Passed - Last BP in normal range    BP Readings from Last 1 Encounters:  04/29/23 126/78         Passed - Valid encounter within last 6 months    Recent Outpatient Visits           4 months ago Annual physical exam   [place] [facility] at [facility], Nyoka Cowden, MD   10 months ago Essential hypertension   [place] [facility] at [facility], Nyoka Cowden, MD   1 year ago Essential hypertension   [place] [facility] at [facility], Nyoka Cowden, MD   1 year ago Annual physical exam   [facility] at [facility], Nyoka Cowden, MD   1 year ago Essential hypertension   [place] [facility] at [facility], Nyoka Cowden, MD       Future Appointments             In 3 weeks Judithann Graves Nyoka Cowden, MD [facility] at [facility], [facility]

## 2023-10-07 ENCOUNTER — Ambulatory Visit (INDEPENDENT_AMBULATORY_CARE_PROVIDER_SITE_OTHER): Payer: Medicare (Managed Care) | Admitting: Internal Medicine

## 2023-10-07 ENCOUNTER — Encounter: Payer: Self-pay | Admitting: Internal Medicine

## 2023-10-07 VITALS — BP 118/68 | HR 65 | Ht 67.0 in | Wt 231.4 lb

## 2023-10-07 DIAGNOSIS — Z6836 Body mass index (BMI) 36.0-36.9, adult: Secondary | ICD-10-CM | POA: Diagnosis not present

## 2023-10-07 DIAGNOSIS — M810 Age-related osteoporosis without current pathological fracture: Secondary | ICD-10-CM

## 2023-10-07 DIAGNOSIS — Z1231 Encounter for screening mammogram for malignant neoplasm of breast: Secondary | ICD-10-CM | POA: Diagnosis not present

## 2023-10-07 DIAGNOSIS — I10 Essential (primary) hypertension: Secondary | ICD-10-CM | POA: Diagnosis not present

## 2023-10-07 MED ORDER — LOSARTAN POTASSIUM 100 MG PO TABS
100.0000 mg | ORAL_TABLET | Freq: Every day | ORAL | 1 refills | Status: DC
Start: 2023-10-07 — End: 2024-04-03

## 2023-10-07 NOTE — Patient Instructions (Signed)
 Call [facility] to schedule your mammogram at [phone].

## 2023-10-07 NOTE — Progress Notes (Deleted)
Verapamil 180mg 

## 2023-10-07 NOTE — Assessment & Plan Note (Signed)
 Work in [employer] - esp cutting back on chips and snacks.

## 2023-10-07 NOTE — Assessment & Plan Note (Signed)
 Controlled BP with normal exam. Current regimen is losartan in the AM and Cardiazem in the PM.  She complains of nocturia x 5 which she believes is due to Cardiazem. Move Cardiazem to AM and reduce fluid consumption after supper. Will continue same medications; encourage continued reduced sodium diet.

## 2023-10-07 NOTE — Progress Notes (Addendum)
 Date:  10/07/2023   Name:  Elizabeth Franco   DOB:  05-Sep-1945   MRN:  [id number]   Chief Complaint: Hypertension, Joint Swelling (Left ankle. Patient said she notice swelling, no pain, notice the swelling go down when she lay down), and Medication Management (Verapamil 180 mg, patient would like to discuss cutting down the dosage, makes her go to the restroom all night  )  Hypertension This is a chronic problem. The problem is controlled. Pertinent negatives include no chest pain, headaches, palpitations or shortness of breath. Past treatments include angiotensin blockers and calcium channel blockers. The current treatment provides significant improvement. There are no compliance problems (but she feels that she has to urinate all night long).  There is no history of kidney disease, CAD/MI or CVA.    Review of Systems  Constitutional:  Negative for chills, fatigue and fever.  HENT:  Negative for trouble swallowing.   Respiratory:  Negative for chest tightness, shortness of breath and wheezing.   Cardiovascular:  Positive for leg swelling (left ankle by the end of the day). Negative for chest pain and palpitations.  Genitourinary:  Negative for dysuria and urgency.  Neurological:  Negative for dizziness and headaches.  Psychiatric/Behavioral:  Negative for dysphoric mood and sleep disturbance. The patient is not nervous/anxious.      Lab Results  Component Value Date   NA 140 04/29/2023   K 4.0 04/29/2023   CO2 19 (L) 04/29/2023   GLUCOSE 81 04/29/2023   BUN 7 (L) 04/29/2023   CREATININE 0.56 (L) 04/29/2023   CALCIUM 10.2 04/29/2023   EGFR 94 04/29/2023   Lab Results  Component Value Date   CHOL 173 04/29/2023   HDL 70 04/29/2023   LDLCALC 87 04/29/2023   TRIG 90 04/29/2023   CHOLHDL 2.5 04/29/2023   Lab Results  Component Value Date   TSH 1.350 04/29/2023   No results found for: "HGBA1C" Lab Results  Component Value Date   WBC 5.0 04/29/2023   HGB 12.2 04/29/2023    HCT 38.4 04/29/2023   MCV 93 04/29/2023   PLT 324 04/29/2023   Lab Results  Component Value Date   ALT 14 04/29/2023   AST 15 04/29/2023   ALKPHOS 84 04/29/2023   BILITOT 0.4 04/29/2023   Lab Results  Component Value Date   VD25OH 31.0 04/29/2023     Patient Active Problem List   Diagnosis Date Noted   Vitamin D deficiency 12/24/2021   Hyperparathyroidism (HCC) 08/26/2021   Essential hypertension 04/19/2021   Age-related osteoporosis without current pathological fracture 02/11/2016   BMI 36.0-36.9,adult 07/12/2015   Osteoarthritis of both knees 05/07/2014   Hypercholesterolemia 05/07/2014   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  Current Meds  Medication Sig   cholecalciferol (VITAMIN D3) 25 MCG (1000 UNIT) tablet Take 1,000 Units by mouth daily.   ibuprofen (ADVIL) 600 MG tablet Take 1 tablet (600 mg total) by mouth 2 (two) times  daily as needed (foot pain).   losartan (COZAAR) 100 MG tablet Take 1 tablet (100 mg total) by mouth daily.   omega-3 acid ethyl esters (LOVAZA) 1 g capsule Take by mouth 2 (two) times daily.   risedronate (ACTONEL) 150 MG tablet Take 150 mg by mouth every 30 (thirty) days.   simvastatin (ZOCOR) 10 MG tablet TAKE 1 TABLET EVERY DAY   TURMERIC PO Take 1 capsule by mouth daily. 250mg    verapamil (CALAN-SR) 180 MG CR tablet TAKE 1 TABLET AT BEDTIME   [DISCONTINUED] losartan (COZAAR) 100 MG tablet Take 1 tablet (100 mg total) by mouth daily.       10/07/2023    9:20 AM 04/29/2023    8:57 AM 10/24/2022     9:38 AM 07/02/2022   10:43 AM  GAD 7 : Generalized Anxiety Score  Nervous, Anxious, on Edge 0 0 0 0  Control/stop worrying 0 0 0 0  Worry too much - different things 0 0 0 0  Trouble relaxing 0 0 0 0  Restless 0 0 0 0  Easily annoyed or irritable 0 0 0 0  Afraid - awful might happen 0 0 0 0  Total GAD 7 Score 0 0 0 0  Anxiety Difficulty Not difficult at all Not difficult at all Not difficult at all Not difficult at all       10/07/2023    9:19 AM 04/29/2023    8:57 AM 10/24/2022    9:37 AM  Depression screen PHQ 2/9  Decreased Interest 0 0 0  Down, Depressed, Hopeless 0 0 0  PHQ - 2 Score 0 0 0  Altered sleeping 0 0 0  Tired, decreased energy 0 0 0  Change in appetite 0 0 0  Feeling bad or failure about yourself  0 0 0  Trouble concentrating 0 0 0  Moving slowly or fidgety/restless 0 0 0  Suicidal thoughts 0 0 0  PHQ-9 Score 0 0 0  Difficult doing work/chores Not difficult at all Not difficult at all Not difficult at all    BP Readings from Last 3 Encounters:  10/07/23 118/68  04/29/23 126/78  01/05/23 (!) 152/85    Physical Exam Vitals and nursing note reviewed.  Constitutional:      General: She is not in acute distress.    Appearance: She is well-developed. She is obese.  HENT:     Head: Normocephalic and atraumatic.  Neck:     Vascular: No carotid bruit.  Cardiovascular:     Rate and Rhythm: Normal rate and regular rhythm.     Pulses: Normal pulses.     Heart sounds: No murmur heard. Pulmonary:     Effort: Pulmonary effort is normal. No respiratory distress.     Breath sounds: No wheezing or rhonchi.  Musculoskeletal:     Cervical back: Normal range of motion.     Right lower leg: No edema.     Left lower leg: Edema (trace edema) present.  Lymphadenopathy:     Cervical: No cervical adenopathy.  Skin:    General: Skin is warm and dry.     Capillary Refill: Capillary refill takes less than 2 seconds.     Findings: No rash.  Neurological:     General:  No focal deficit present.     Mental Status: She is alert and oriented to person, place, and time.  Psychiatric:        Mood and Affect: Mood normal.  Behavior: Behavior normal.     Wt Readings from Last 3 Encounters:  10/07/23 231 lb 6 oz (105 kg)  04/29/23 222 lb (100.7 kg)  10/24/22 223 lb (101.2 kg)    BP 118/68   Pulse 65   Ht 5\' 7"  (1.702 m)   Wt 231 lb 6 oz (105 kg)   SpO2 95%   BMI 36.24 kg/m   Assessment and Plan:  Problem List Items Addressed This Visit       Unprioritized   Essential hypertension (Chronic)   Controlled BP with normal exam. Current regimen is losartan in the AM and Cardiazem in the PM.  She complains of nocturia x 5 which she believes is due to Cardiazem. Move Cardiazem to AM and reduce fluid consumption after supper. Will continue same medications; encourage continued reduced sodium diet.       Relevant Medications   losartan (COZAAR) 100 MG tablet   BMI 36.0-36.9,adult   Work in improving diet - esp cutting back on chips and snacks.      Age-related osteoporosis without current pathological fracture   Recent Reclast infusion without side effects Calcium improved. Will follow up with Endo.      Other Visit Diagnoses       Encounter for screening mammogram for breast cancer    -  Primary   Relevant Orders   MM 3D SCREENING MAMMOGRAM BILATERAL BREAST       Return in about 6 months (around 04/08/2024) for CPX.    Reubin Milan, MD [facility] [place]

## 2023-10-07 NOTE — Assessment & Plan Note (Signed)
 Recent Reclast infusion without side effects Calcium improved. Will follow up with Endo.

## 2023-10-08 ENCOUNTER — Ambulatory Visit: Payer: Medicare (Managed Care) | Admitting: Emergency Medicine

## 2023-10-08 VITALS — Ht 67.0 in | Wt 234.0 lb

## 2023-10-08 DIAGNOSIS — Z780 Asymptomatic menopausal state: Secondary | ICD-10-CM

## 2023-10-08 DIAGNOSIS — M810 Age-related osteoporosis without current pathological fracture: Secondary | ICD-10-CM

## 2023-10-08 DIAGNOSIS — Z0000 Encounter for general adult medical examination without abnormal findings: Secondary | ICD-10-CM

## 2023-10-08 NOTE — Patient Instructions (Addendum)
 [name] , Thank you for taking time to come for your Medicare Wellness Visit. I appreciate your ongoing commitment to your health goals. Please review the following plan we discussed and let me know if I can assist you in the future.   Referrals/Orders/Follow-Ups/Clinician Recommendations: Dr. Judithann Graves placed an order for a mammogram yesterday. Your mammogram is due 12/31/23. Call [facility] @ [phone] to schedule. Since you are seeing Endocrinology, will defer bone density scan for them to order.  This is a list of the screening recommended for you and due dates:  Health Maintenance  Topic Date Due   Zoster (Shingles) Vaccine (1 of 2) Never done   COVID-19 Vaccine (5 - 2024-25 season) 10/23/2023*   DEXA scan (bone density measurement)  10/16/2023   Mammogram  12/31/2023   Medicare Annual Wellness Visit  10/07/2024   DTaP/Tdap/Td vaccine (2 - Td or Tdap) 01/08/2025   Pneumonia Vaccine  Completed   Flu Shot  Completed   Hepatitis C Screening  Completed   HPV Vaccine  Aged Out   Colon Cancer Screening  Discontinued  *Topic was postponed. The date shown is not the original due date.    Advanced directives: (ACP Link)Information on Advanced Care Planning can be found at [facility] Advance Health Care Directives Advance Health Care Directives ([email or web address] You may also get these forms at your doctor's office.  Once you have completed the forms, please bring a copy of your health care power of attorney and living will to the office to be added to your chart at your convenience.   Next Medicare Annual Wellness Visit scheduled for next year: Yes, 10/20/24 @ 8:00am (phone visit)

## 2023-10-08 NOTE — Progress Notes (Signed)
 Subjective:   Elizabeth Franco is a 78 y.o. who presents for a Medicare Wellness preventive visit.  Visit Complete: Virtual I connected with  Zeina Akkerman on 10/08/23 by a audio enabled telemedicine application and verified that I am speaking with the correct person using two identifiers.  Patient Location: Home  Provider Location: Home Office  I discussed the limitations of evaluation and management by telemedicine. The patient expressed understanding and agreed to proceed.  Vital Signs: Because this visit was a virtual/telehealth visit, some criteria may be missing or patient reported. Any vitals not documented were not able to be obtained and vitals that have been documented are patient reported.  VideoDeclined- This patient declined Librarian, academic. Therefore the visit was completed with audio only.  AWV Questionnaire: No: Patient Medicare AWV questionnaire was not completed prior to this visit.  Cardiac Risk Factors include: advanced age (>33men, >3 women);hypertension;dyslipidemia;obesity (BMI >30kg/m2)     Objective:    Today's Vitals   10/08/23 0759  Weight: 234 lb (106.1 kg)  Height: 5\' 7"  (1.702 m)   Body mass index is 36.65 kg/m.     10/08/2023    8:11 AM 01/05/2023    8:57 AM 09/18/2022   10:39 AM 09/09/2021   11:04 AM 06/03/2017    9:44 AM  Advanced Directives  Does Patient Have a Medical Advance Directive? No No No No No  Would patient like information on creating a medical advance directive? Yes (MAU/Ambulatory/Procedural Areas - Information given)  No - Patient declined Yes (MAU/Ambulatory/Procedural Areas - Information given)     Current Medications (verified) Outpatient Encounter Medications as of 10/08/2023  Medication Sig   cholecalciferol (VITAMIN D3) 25 MCG (1000 UNIT) tablet Take 1,000 Units by mouth daily.   ibuprofen (ADVIL) 600 MG tablet Take 1 tablet (600 mg total) by mouth 2 (two) times daily as needed (foot  pain).   losartan (COZAAR) 100 MG tablet Take 1 tablet (100 mg total) by mouth daily.   omega-3 acid ethyl esters (LOVAZA) 1 g capsule Take by mouth 2 (two) times daily.   simvastatin (ZOCOR) 10 MG tablet TAKE 1 TABLET EVERY DAY   TURMERIC PO Take 1 capsule by mouth daily. 250mg    verapamil (CALAN-SR) 180 MG CR tablet TAKE 1 TABLET AT BEDTIME   risedronate (ACTONEL) 150 MG tablet Take 150 mg by mouth every 30 (thirty) days. (Patient not taking: Reported on 10/08/2023)   No facility-administered encounter medications on file as of 10/08/2023.    Allergies (verified) Fosamax [alendronate]   History: Past Medical History:  Diagnosis Date   Hyperlipemia    Hypertension    Past Surgical History:  Procedure Laterality Date   ABDOMINAL HYSTERECTOMY     BREAST BIOPSY Left    neg   Family History  Problem Relation Age of Onset   Hypertension Mother    Breast cancer Mother 78   Breast cancer Maternal Aunt    Diabetes Maternal Grandmother    Breast cancer Maternal Grandmother 41   Social History   Socioeconomic History   Marital status: Widowed    Spouse name: Not on file   Number of children: 5   Years of education: Not on file   Highest education level: Not on file  Occupational History   Occupation: retired  Tobacco Use   Smoking status: Never    Passive exposure: Past   Smokeless tobacco: Never  Vaping Use   Vaping status: Never Used  Substance and Sexual  Activity   Alcohol use: No   Drug use: Never   Sexual activity: Not Currently  Other Topics Concern   Not on file  Social History Narrative   3 living children   Pt lives alone; grandson stays sometimes   Social Drivers of Health   Financial Resource Strain: Low Risk  (10/08/2023)   Overall Financial Resource Strain (CARDIA)    Difficulty of Paying Living Expenses: Not hard at all  Food Insecurity: No Food Insecurity (10/08/2023)   Hunger Vital Sign    Worried About Running Out of Food in the Last Year: Never true     Ran Out of Food in the Last Year: Never true  Transportation Needs: No Transportation Needs (10/08/2023)   PRAPARE - Administrator, Civil Service (Medical): No    Lack of Transportation (Non-Medical): No  Physical Activity: Insufficiently Active (10/08/2023)   Exercise Vital Sign    Days of Exercise per Week: 4 days    Minutes of Exercise per Session: 10 min  Stress: No Stress Concern Present (10/08/2023)   [employer] of Occupational Health - Occupational Stress Questionnaire    Feeling of Stress : Not at all  Social Connections: Moderately Integrated (10/08/2023)   Social Connection and Isolation Panel [NHANES]    Frequency of Communication with Friends and Family: More than three times a week    Frequency of Social Gatherings with Friends and Family: More than three times a week    Attends Religious Services: More than 4 times per year    Active Member of [employer] or Organizations: Yes    Attends Banker Meetings: More than 4 times per year    Marital Status: Widowed    Tobacco Counseling Counseling given: Not Answered    Clinical Intake:  Pre-visit preparation completed: Yes  Pain : No/denies pain     BMI - recorded: 36.65 Nutritional Status: BMI > 30  Obese Nutritional Risks: None Diabetes: No  How often do you need to have someone help you when you read instructions, pamphlets, or other written materials from your doctor or pharmacy?: 1 - Never  Interpreter Needed?: No  Information entered by :: Tora Kindred, CMA   Activities of Daily Living     10/08/2023    8:01 AM  In your present state of health, do you have any difficulty performing the following activities:  Hearing? 0  Vision? 0  Difficulty concentrating or making decisions? 0  Walking or climbing stairs? 0  Dressing or bathing? 0  Doing errands, shopping? 0  Preparing Food and eating ? N  Using the Toilet? N  In the past six months, have you accidently leaked urine? N   Do you have problems with loss of bowel control? N  Managing your Medications? N  Managing your Finances? N  Housekeeping or managing your Housekeeping? N    Patient Care Team: Reubin Milan, MD as PCP - General (Internal Medicine) Tedd Sias Marlana Salvage, MD as Physician Assistant (Endocrinology)  Indicate any recent Medical Services you may have received from other than [facility] providers in the past year (date may be approximate).     Assessment:   This is a routine wellness examination for Elizabeth Franco.  Hearing/Vision screen Hearing Screening - Comments:: No hearing loss Vision Screening - Comments:: Gets eye exams, [facility], [place]   Goals Addressed               This Visit's Progress  Patient Stated (pt-stated)        Lose 15 lbs       Depression Screen     10/08/2023    8:09 AM 10/07/2023    9:19 AM 04/29/2023    8:57 AM 10/24/2022    9:37 AM 09/18/2022   10:37 AM 07/02/2022   10:42 AM 04/25/2022   10:58 AM  PHQ 2/9 Scores  PHQ - 2 Score 0 0 0 0 0 0 0  PHQ- 9 Score 0 0 0 0 0 0 0    Fall Risk     10/08/2023    8:13 AM 10/07/2023    9:11 AM 04/29/2023    8:57 AM 10/24/2022    9:38 AM 09/18/2022   10:41 AM  Fall Risk   Falls in the past year? 0 0 0 0 0  Number falls in past yr: 0 0 0 0 0  Injury with Fall? 0 0 0 0 0  Risk for fall due to : No Fall Risks No Fall Risks No Fall Risks No Fall Risks No Fall Risks  Follow up Falls prevention discussed;Falls evaluation completed Falls evaluation completed Falls evaluation completed Falls evaluation completed Falls prevention discussed;Falls evaluation completed    MEDICARE RISK AT HOME:  Medicare Risk at Home Any stairs in or around the home?: Yes If so, are there any without handrails?: No Home free of loose throw rugs in walkways, pet beds, electrical cords, etc?: Yes Adequate lighting in your home to reduce risk of falls?: Yes Life alert?: No Use of a cane, walker or w/c?: No Grab bars in the bathroom?: Yes Shower  chair or bench in shower?: No Elevated toilet seat or a handicapped toilet?: No  TIMED UP AND GO:  Was the test performed?  No  Cognitive Function: 6CIT completed        10/08/2023    8:15 AM 09/18/2022   10:42 AM 09/09/2021   11:11 AM  6CIT Screen  What Year? 0 points 0 points 0 points  What month? 0 points 0 points 0 points  What time? 0 points 0 points 0 points  Count back from 20 2 points 0 points 0 points  Months in reverse 0 points 0 points 2 points  Repeat phrase 6 points 6 points 4 points  Total Score 8 points 6 points 6 points    Immunizations Immunization History  Administered Date(s) Administered   Fluad Quad(high Dose 65+) 05/03/2019, 04/24/2021, 04/25/2022   Fluad Trivalent(High Dose 65+) 04/29/2023   Influenza, High Dose Seasonal PF 05/04/2018   Influenza-Unspecified 06/16/2015, 06/04/2016, 05/11/2020   Moderna Sars-Covid-2 Vaccination 08/21/2019, 09/18/2019, 06/06/2020, 01/30/2021   PNEUMOCOCCAL CONJUGATE-20 04/25/2022   Pneumococcal Conjugate-13 08/01/2016   Pneumococcal Polysaccharide-23 11/09/2012   Tdap 01/09/2015    Screening Tests Health Maintenance  Topic Date Due   Zoster Vaccines- Shingrix (1 of 2) Never done   COVID-19 Vaccine (5 - 2024-25 season) 10/23/2023 (Originally 04/05/2023)   DEXA SCAN  10/16/2023   MAMMOGRAM  12/31/2023   Medicare Annual Wellness (AWV)  10/07/2024   DTaP/Tdap/Td (2 - Td or Tdap) 01/08/2025   Pneumonia Vaccine 17+ Years old  Completed   INFLUENZA VACCINE  Completed   Hepatitis C Screening  Completed   HPV VACCINES  Aged Out   Colonoscopy  Discontinued    Health Maintenance  Health Maintenance Due  Topic Date Due   Zoster Vaccines- Shingrix (1 of 2) Never done   Health Maintenance Items Addressed: See Nurse Notes  Additional Screening:  Vision Screening: Recommended annual ophthalmology exams for early detection of glaucoma and other disorders of the eye.  Dental Screening: Recommended annual dental exams  for proper oral hygiene  Community Resource Referral / Chronic Care Management: CRR required this visit?  No   CCM required this visit?  No     Plan:     I have personally reviewed and noted the following in the patient's chart:   Medical and social history Use of alcohol, tobacco or illicit drugs  Current medications and supplements including opioid prescriptions. Patient is not currently taking opioid prescriptions. Functional ability and status Nutritional status Physical activity Advanced directives List of other physicians Hospitalizations, surgeries, and ER visits in previous 12 months Vitals Screenings to include cognitive, depression, and falls Referrals and appointments  In addition, I have reviewed and discussed with patient certain preventive protocols, quality metrics, and best practice recommendations. A written personalized care plan for preventive services as well as general preventive health recommendations were provided to patient.     Tora Kindred, CMA   10/08/2023   After Visit Summary: (MyChart) Due to this being a telephonic visit, the after visit summary with patients personalized plan was offered to patient via MyChart   Notes:  6 CIT Score - 8 Declined shingles and covid vaccines at this time Due for DEXA, defer to Endocrinology

## 2023-12-31 ENCOUNTER — Telehealth: Payer: Self-pay | Admitting: Internal Medicine

## 2023-12-31 ENCOUNTER — Other Ambulatory Visit: Payer: Self-pay | Admitting: Internal Medicine

## 2023-12-31 NOTE — Telephone Encounter (Signed)
 Copied from CRM [id number]. Topic: General - Other >> Dec 31, 2023  8:39 AM Emylou G wrote: Reason for CRM: Patient dropped paperwork for her decal on handicap?  She called for status

## 2023-12-31 NOTE — Telephone Encounter (Signed)
 Spoke with patient, informed her that form was completed and mailed to her home address. Patient verbalized understanding.

## 2023-12-31 NOTE — Telephone Encounter (Unsigned)
 Copied from CRM [id number]. Topic: Clinical - Medication Refill >> Dec 31, 2023  8:37 AM Emylou G wrote: Medication: verapamil  (CALAN -SR) 180 MG CR tablet  Has the patient contacted their pharmacy? No (Agent: If no, request that the patient contact the pharmacy for the refill. If patient does not wish to contact the pharmacy document the reason why and proceed with request.) (Agent: If yes, when and what did the pharmacy advise?)  This is the patient's preferred pharmacy:    [id number], [place] - [address] AT [facility] [address] [place] [address] Phone: [phone] Fax: [phone]  Is this the correct pharmacy for this prescription? Yes If no, delete pharmacy and type the correct one.   Has the prescription been filled recently? No  Is the patient out of the medication? Yes  Has the patient been seen for an appointment in the last year OR does the patient have an upcoming appointment? Yes  Can we respond through MyChart? No  Agent: Please be advised that Rx refills may take up to 3 business days. We ask that you follow-up with your pharmacy.

## 2024-01-01 MED ORDER — VERAPAMIL HCL ER 180 MG PO TBCR: 180.0000 mg | EXTENDED_RELEASE_TABLET | Freq: Every day | ORAL | 1 refills | Status: DC

## 2024-01-01 NOTE — Telephone Encounter (Signed)
 Requested Prescriptions  Pending Prescriptions Disp Refills   verapamil  (CALAN -SR) 180 MG CR tablet 90 tablet 1    Sig: Take 1 tablet (180 mg total) by mouth at bedtime.     Cardiovascular: Calcium Channel Blockers 3 Failed - 01/01/2024  5:29 PM      Failed - Cr in normal range and within 360 days    Creatinine, Ser  Date Value Ref Range Status  04/29/2023 0.56 (L) 0.57 - 1.00 mg/dL Final         Passed - ALT in normal range and within 360 days    ALT  Date Value Ref Range Status  04/29/2023 14 0 - 32 IU/L Final         Passed - AST in normal range and within 360 days    AST  Date Value Ref Range Status  04/29/2023 15 0 - 40 IU/L Final         Passed - Last BP in normal range    BP Readings from Last 1 Encounters:  10/07/23 118/68         Passed - Last Heart Rate in normal range    Pulse Readings from Last 1 Encounters:  10/07/23 65         Passed - Valid encounter within last 6 months    Recent Outpatient Visits           2 months ago Encounter for screening mammogram for breast cancer   [place] [facility] at [facility], Chales [place], MD       Future Appointments             In 3 months Gala Jubilee, [place]ales [place], MD [facility] at [facility], [facility]

## 2024-02-23 ENCOUNTER — Other Ambulatory Visit: Payer: Self-pay | Admitting: Internal Medicine

## 2024-02-23 DIAGNOSIS — E7800 Pure hypercholesterolemia, unspecified: Secondary | ICD-10-CM

## 2024-02-23 NOTE — Telephone Encounter (Unsigned)
 Copied from CRM [id number]. Topic: Clinical - Medication Refill >> Feb 23, 2024 11:41 AM Antwanette L wrote: Medication: simvastatin  (ZOCOR ) 10 MG tablet  Has the patient contacted their pharmacy? No   This is the patient's preferred pharmacy:   [id number], [place] - [address] AT [facility] [address] [facility] [place] [address] Phone: [phone] Fax: [phone]  Is this the correct pharmacy for this prescription? Yes    Has the prescription been filled recently? No. Last refill was on 07/13/23  Is the patient out of the medication? Yes  Has the patient been seen for an appointment in the last year OR does the patient have an upcoming appointment? Yes. Last ov with Dr. Justus was on 10/07/23  Can we respond through MyChart? No. Contact the patient by phone at [phone]  Agent: Please be advised that Rx refills may take up to 3 business days. We ask that you follow-up with your pharmacy.

## 2024-02-23 NOTE — Telephone Encounter (Signed)
 Last OV 10/07/23 with Dr. Justus

## 2024-02-25 MED ORDER — SIMVASTATIN 10 MG PO TABS: 10.0000 mg | ORAL_TABLET | Freq: Every day | ORAL | 0 refills | Status: DC

## 2024-02-25 NOTE — Telephone Encounter (Signed)
 Requested Prescriptions  Pending Prescriptions Disp Refills   simvastatin  (ZOCOR ) 10 MG tablet 90 tablet 0    Sig: Take 1 tablet (10 mg total) by mouth daily.     Cardiovascular:  Antilipid - Statins Failed - 02/25/2024  1:12 PM      Failed - Lipid Panel in normal range within the last 12 months    Cholesterol, Total  Date Value Ref Range Status  04/29/2023 173 100 - 199 mg/dL Final   LDL Chol Calc (NIH)  Date Value Ref Range Status  04/29/2023 87 0 - 99 mg/dL Final   HDL  Date Value Ref Range Status  04/29/2023 70 >39 mg/dL Final   Triglycerides  Date Value Ref Range Status  04/29/2023 90 0 - 149 mg/dL Final         Passed - Patient is not pregnant      Passed - Valid encounter within last 12 months    Recent Outpatient Visits           4 months ago Encounter for screening mammogram for breast cancer   [place] [facility] at [facility], Leita DEL, MD       Future Appointments             In 1 month Justus, Leita DEL, MD [facility] at [facility], [facility]

## 2024-02-29 DIAGNOSIS — M810 Age-related osteoporosis without current pathological fracture: Secondary | ICD-10-CM | POA: Diagnosis not present

## 2024-03-22 ENCOUNTER — Ambulatory Visit
Admission: RE | Admit: 2024-03-22 | Discharge: 2024-03-22 | Disposition: A | Discharge status: Home / Self Care | Payer: Medicare (Managed Care) | Source: Ambulatory Visit | Attending: Internal Medicine | Admitting: Internal Medicine

## 2024-03-22 DIAGNOSIS — Z1231 Encounter for screening mammogram for malignant neoplasm of breast: Secondary | ICD-10-CM | POA: Insufficient documentation

## 2024-03-29 DIAGNOSIS — H524 Presbyopia: Secondary | ICD-10-CM | POA: Diagnosis not present

## 2024-04-01 ENCOUNTER — Other Ambulatory Visit: Payer: Self-pay | Admitting: Internal Medicine

## 2024-04-01 DIAGNOSIS — I10 Essential (primary) hypertension: Secondary | ICD-10-CM

## 2024-04-03 NOTE — Telephone Encounter (Signed)
 Requested Prescriptions  Pending Prescriptions Disp Refills   losartan  (COZAAR ) 100 MG tablet [Pharmacy Med Name: LOSARTAN  100MG  TABLETS] 90 tablet 0    Sig: TAKE 1 TABLET(100 MG) BY MOUTH DAILY     Cardiovascular:  Angiotensin Receptor Blockers Failed - 04/03/2024 10:34 AM      Failed - Cr in normal range and within 180 days    Creatinine, Ser  Date Value Ref Range Status  04/29/2023 0.56 (L) 0.57 - 1.00 mg/dL Final         Failed - K in normal range and within 180 days    Potassium  Date Value Ref Range Status  04/29/2023 4.0 3.5 - 5.2 mmol/L Final         Passed - Patient is not pregnant      Passed - Last BP in normal range    BP Readings from Last 1 Encounters:  10/07/23 118/68         Passed - Valid encounter within last 6 months    Recent Outpatient Visits           5 months ago Encounter for screening mammogram for breast cancer   [place] [facility] at [facility], Leita DEL, MD       Future Appointments             In 1 week Justus, Leita DEL, MD [facility] at [facility], [phone] [place]

## 2024-04-11 ENCOUNTER — Ambulatory Visit (INDEPENDENT_AMBULATORY_CARE_PROVIDER_SITE_OTHER): Payer: Medicare (Managed Care) | Admitting: Internal Medicine

## 2024-04-11 ENCOUNTER — Encounter: Payer: Self-pay | Admitting: Internal Medicine

## 2024-04-11 VITALS — BP 126/70 | HR 76 | Ht 67.0 in | Wt 230.0 lb

## 2024-04-11 DIAGNOSIS — I10 Essential (primary) hypertension: Secondary | ICD-10-CM

## 2024-04-11 DIAGNOSIS — E213 Hyperparathyroidism, unspecified: Secondary | ICD-10-CM

## 2024-04-11 DIAGNOSIS — Z0000 Encounter for general adult medical examination without abnormal findings: Secondary | ICD-10-CM | POA: Diagnosis not present

## 2024-04-11 DIAGNOSIS — Z23 Encounter for immunization: Secondary | ICD-10-CM

## 2024-04-11 DIAGNOSIS — M810 Age-related osteoporosis without current pathological fracture: Secondary | ICD-10-CM | POA: Diagnosis not present

## 2024-04-11 DIAGNOSIS — E7800 Pure hypercholesterolemia, unspecified: Secondary | ICD-10-CM

## 2024-04-11 MED ORDER — SIMVASTATIN 10 MG PO TABS: 10.0000 mg | ORAL_TABLET | Freq: Every day | ORAL | 1 refills | Status: AC

## 2024-04-11 NOTE — Assessment & Plan Note (Signed)
 LDL is  Lab Results  Component Value Date   LDLCALC 87 04/29/2023   Currently taking simvastatin .  No medication side effects or other concerns. Recommended LDL goal is < 100.

## 2024-04-11 NOTE — Progress Notes (Signed)
 Date:  04/11/2024   Name:  Elizabeth Franco   DOB:  1946/01/05   MRN:  [id number]   Chief Complaint: Annual Exam Elizabeth Franco is a 78 y.o. female who presents today for her Complete Annual Exam. She feels well. She reports exercising. She reports she is sleeping fairly well. Breast complaints - none.  Health Maintenance  Topic Date Due   DEXA scan (bone density measurement)  10/16/2023   COVID-19 Vaccine (5 - 2025-26 season) 04/27/2024*   Zoster (Shingles) Vaccine (1 of 2) 07/11/2024*   Medicare Annual Wellness Visit  10/07/2024   DTaP/Tdap/Td vaccine (2 - Td or Tdap) 01/08/2025   Mammogram  03/22/2025   Pneumococcal Vaccine for age over 48  Completed   Flu Shot  Completed   Hepatitis C Screening  Completed   HPV Vaccine  Aged Out   Meningitis B Vaccine  Aged Out   Colon Cancer Screening  Discontinued  *Topic was postponed. The date shown is not the original due date.    Hypertension Pertinent negatives include no chest pain, headaches, palpitations or shortness of breath. Past treatments include angiotensin blockers and calcium channel blockers. The current treatment provides significant improvement. There is no history of kidney disease, CAD/MI or CVA.  Hyperlipidemia This is a chronic problem. The problem is controlled. Pertinent negatives include no chest pain, myalgias or shortness of breath. Current antihyperlipidemic treatment includes statins. The current treatment provides significant improvement of lipids.    Review of Systems  Constitutional:  Negative for fatigue and unexpected weight change.  HENT:  Negative for trouble swallowing.   Eyes:  Negative for visual disturbance.  Respiratory:  Negative for cough, chest tightness, shortness of breath and wheezing.   Cardiovascular:  Negative for chest pain, palpitations and leg swelling.  Gastrointestinal:  Negative for abdominal pain, constipation and diarrhea.  Musculoskeletal:  Negative for arthralgias and  myalgias.  Neurological:  Negative for dizziness, weakness, light-headedness and headaches.     Lab Results  Component Value Date   NA 140 04/29/2023   K 4.0 04/29/2023   CO2 19 (L) 04/29/2023   GLUCOSE 81 04/29/2023   BUN 7 (L) 04/29/2023   CREATININE 0.56 (L) 04/29/2023   CALCIUM 10.2 04/29/2023   EGFR 94 04/29/2023   Lab Results  Component Value Date   CHOL 173 04/29/2023   HDL 70 04/29/2023   LDLCALC 87 04/29/2023   TRIG 90 04/29/2023   CHOLHDL 2.5 04/29/2023   Lab Results  Component Value Date   TSH 1.350 04/29/2023   No results found for: HGBA1C Lab Results  Component Value Date   WBC 5.0 04/29/2023   HGB 12.2 04/29/2023   HCT 38.4 04/29/2023   MCV 93 04/29/2023   PLT 324 04/29/2023   Lab Results  Component Value Date   ALT 14 04/29/2023   AST 15 04/29/2023   ALKPHOS 84 04/29/2023   BILITOT 0.4 04/29/2023   Lab Results  Component Value Date   VD25OH 31.0 04/29/2023     Patient Active Problem List   Diagnosis Date Noted   Severe obesity (BMI 35.0-39.9) with comorbidity (HCC) 04/11/2024   Vitamin D  deficiency 12/24/2021   Hyperparathyroidism (HCC) 08/26/2021   Essential hypertension 04/19/2021   Age-related osteoporosis without current pathological fracture 02/11/2016   BMI 36.0-36.9,adult 07/12/2015   Osteoarthritis of both knees 05/07/2014   Hypercholesterolemia 05/07/2014   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  Current Meds  Medication Sig   cholecalciferol (VITAMIN D3)  25 MCG (1000 UNIT) tablet Take 1,000 Units by mouth daily.   ibuprofen  (ADVIL ) 600 MG tablet Take 1 tablet (600 mg total) by mouth 2 (two) times daily as needed (foot pain).   losartan  (COZAAR ) 100 MG tablet TAKE 1 TABLET(100 MG) BY MOUTH DAILY   omega-3 acid ethyl esters (LOVAZA) 1 g capsule Take by mouth 2 (two) times daily.   TURMERIC PO Take 1 capsule by mouth daily. 250mg    verapamil  (CALAN -SR) 180 MG CR tablet Take 1 tablet (180 mg total) by mouth at bedtime.   [DISCONTINUED] simvastatin  (ZOCOR ) 10 MG tablet Take 1 tablet (10 mg total) by mouth daily.       04/11/2024    9:27 AM 10/07/2023    9:20 AM 04/29/2023    8:57 AM 10/24/2022    9:38 AM  GAD 7 : Generalized Anxiety Score  Nervous, Anxious, on Edge 0 0 0 0  Control/stop worrying 0 0 0 0  Worry too much - different things 0 0 0 0  Trouble relaxing 0 0 0 0  Restless 0 0 0 0  Easily annoyed or irritable 0 0 0 0  Afraid - awful might happen 0 0 0 0  Total GAD 7 Score 0 0 0 0  Anxiety Difficulty Not difficult at all Not difficult at all Not difficult at all Not difficult at all       04/11/2024    9:27 AM 10/08/2023    8:09 AM 10/07/2023    9:19 AM  Depression screen PHQ 2/9  Decreased Interest 0 0 0  Down, Depressed, Hopeless 0 0 0  PHQ - 2 Score 0 0 0  Altered sleeping 0 0 0  Tired, decreased energy 0 0 0  Change in appetite 0 0 0  Feeling bad or failure about yourself  0 0 0  Trouble concentrating 0 0 0  Moving slowly or fidgety/restless 0 0 0  Suicidal thoughts 0 0 0  PHQ-9 Score 0 0 0  Difficult doing work/chores Not difficult at all Not difficult at all Not difficult at all    BP Readings from Last 3 Encounters:  04/11/24 126/70  10/07/23 118/68  04/29/23 126/78    Physical Exam Vitals and nursing note reviewed.  Constitutional:      General: She is not in acute distress.    Appearance: She is well-developed.  HENT:     Head: Normocephalic and atraumatic.     Right Ear: Tympanic membrane and ear canal  normal.     Left Ear: Tympanic membrane and ear canal normal.     Nose:     Right Sinus: No maxillary sinus tenderness.     Left Sinus: No maxillary sinus tenderness.  Eyes:     General: No scleral icterus.       Right eye: No discharge.        Left eye: No discharge.  Conjunctiva/sclera: Conjunctivae normal.  Neck:     Thyroid : No thyromegaly.     Vascular: No carotid bruit.  Cardiovascular:     Rate and Rhythm: Normal rate and regular rhythm.     Pulses: Normal pulses.     Heart sounds: Normal heart sounds.  Pulmonary:     Effort: Pulmonary effort is normal. No respiratory distress.     Breath sounds: No wheezing.  Abdominal:     General: Bowel sounds are normal.     Palpations: Abdomen is soft.     Tenderness: There is no abdominal tenderness.  Musculoskeletal:     Cervical back: Normal range of motion. No erythema.     Right lower leg: No edema.     Left lower leg: No edema.  Lymphadenopathy:     Cervical: No cervical adenopathy.  Skin:    General: Skin is warm and dry.     Findings: No rash.  Neurological:     Mental Status: She is alert and oriented to person, place, and time.     Cranial Nerves: No cranial nerve deficit.     Sensory: No sensory deficit.     Deep Tendon Reflexes: Reflexes are normal and symmetric.  Psychiatric:        Attention and Perception: Attention normal.        Mood and Affect: Mood normal.     Wt Readings from Last 3 Encounters:  04/11/24 230 lb (104.3 kg)  10/08/23 234 lb (106.1 kg)  10/07/23 231 lb 6 oz (105 kg)    BP 126/70   Pulse 76   Ht 5' 7 (1.702 m)   Wt 230 lb (104.3 kg)   SpO2 96%   BMI 36.02 kg/m   Assessment and Plan:  Problem List Items Addressed This Visit       Unprioritized   Hypercholesterolemia (Chronic)   LDL is  Lab Results  Component Value Date   LDLCALC 87 04/29/2023   Currently taking simvastatin .  No medication side effects or other concerns. Recommended LDL goal is < 100.        Relevant Medications   simvastatin  (ZOCOR ) 10 MG tablet   Other Relevant Orders   Lipid panel   Essential hypertension (Chronic)   Blood pressure is well controlled on Losartan  and Verapamil . No medication side effects noted. Plan to continue current medications.       Relevant Medications   simvastatin  (ZOCOR ) 10 MG tablet   Other Relevant Orders   CBC with Differential/Platelet   TSH   Urinalysis, Routine w reflex microscopic   Comprehensive metabolic panel with GFR   Hyperparathyroidism (HCC) (Chronic)   Seen by Endo - last calcium normal  No PTH since last year. Advised no calcium supplements      Relevant Orders   Parathyroid  hormone, intact (no Ca)   Comprehensive metabolic panel with GFR   Age-related osteoporosis without current pathological fracture   Now on Reclast  infusions from Endo - took one infusion but it was too expensive ($400/dose) She continues on vitamin D  orally and daily weight bearing exercises. No hx of fractures       Severe obesity (BMI 35.0-39.9) with comorbidity (HCC)   Encourage reduced calorie diet and more exercise as able She is limited by finances w/r to more healthy foods      Other Visit Diagnoses       Annual physical exam    -  Primary   up to date on screenings Flu shot  today declines Shingrix     Encounter for immunization       Relevant Orders   Flu vaccine HIGH DOSE PF(Fluzone Trivalent) (Completed)       Return in about 6 months (around 10/09/2024) for HTN  TOC  Dr. Lemon.    Leita HILARIO Adie, MD [facility] [place]

## 2024-04-11 NOTE — Assessment & Plan Note (Signed)
 Encourage reduced calorie diet and more exercise as able She is limited by finances w/r to more healthy foods

## 2024-04-11 NOTE — Assessment & Plan Note (Signed)
 Blood pressure is well controlled on Losartan  and Verapamil . No medication side effects noted. Plan to continue current medications.

## 2024-04-11 NOTE — Assessment & Plan Note (Signed)
 Seen by Endo - last calcium normal  No PTH since last year. Advised no calcium supplements

## 2024-04-11 NOTE — Assessment & Plan Note (Addendum)
 Now on Reclast  infusions from Endo - took one infusion but it was too expensive ($400/dose) She continues on vitamin D  orally and daily weight bearing exercises. No hx of fractures

## 2024-04-13 ENCOUNTER — Ambulatory Visit: Payer: Self-pay | Admitting: Internal Medicine

## 2024-04-13 LAB — COMPREHENSIVE METABOLIC PANEL WITH GFR
ALT: 8 IU/L (ref 0–32)
AST: 18 IU/L (ref 0–40)
Albumin: 4.3 g/dL (ref 3.8–4.8)
Alkaline Phosphatase: 72 IU/L (ref 44–121)
BUN/Creatinine Ratio: 14 (ref 12–28)
BUN: 9 mg/dL (ref 8–27)
Bilirubin Total: 0.5 mg/dL (ref 0.0–1.2)
CO2: 16 mmol/L — ABNORMAL LOW (ref 20–29)
Calcium: 10.7 mg/dL — ABNORMAL HIGH (ref 8.7–10.3)
Chloride: 106 mmol/L (ref 96–106)
Creatinine, Ser: 0.64 mg/dL (ref 0.57–1.00)
Globulin, Total: 2.4 g/dL (ref 1.5–4.5)
Glucose: 78 mg/dL (ref 70–99)
Potassium: 4.2 mmol/L (ref 3.5–5.2)
Sodium: 137 mmol/L (ref 134–144)
Total Protein: 6.7 g/dL (ref 6.0–8.5)
eGFR: 90 mL/min/1.73 (ref >59)

## 2024-04-13 LAB — CBC WITH DIFFERENTIAL/PLATELET
Basophils Absolute: 0 x10E3/uL (ref 0.0–0.2)
Basos: 1 %
EOS (ABSOLUTE): 0.1 x10E3/uL (ref 0.0–0.4)
Eos: 4 %
Hematocrit: 39.8 % (ref 34.0–46.6)
Hemoglobin: 12.6 g/dL (ref 11.1–15.9)
Immature Grans (Abs): 0 x10E3/uL (ref 0.0–0.1)
Immature Granulocytes: 0 %
Lymphocytes Absolute: 1.1 x10E3/uL (ref 0.7–3.1)
Lymphs: 31 %
MCH: 29.8 pg (ref 26.6–33.0)
MCHC: 31.7 g/dL (ref 31.5–35.7)
MCV: 94 fL (ref 79–97)
Monocytes Absolute: 0.6 x10E3/uL (ref 0.1–0.9)
Monocytes: 15 %
Neutrophils Absolute: 1.8 x10E3/uL (ref 1.4–7.0)
Neutrophils: 49 %
Platelets: 322 x10E3/uL (ref 150–450)
RBC: 4.23 x10E6/uL (ref 3.77–5.28)
RDW: 13.2 % (ref 11.7–15.4)
WBC: 3.6 x10E3/uL (ref 3.4–10.8)

## 2024-04-13 LAB — URINALYSIS, ROUTINE W REFLEX MICROSCOPIC
Bilirubin, UA: NEGATIVE
Glucose, UA: NEGATIVE
Ketones, UA: NEGATIVE
Leukocytes,UA: NEGATIVE
Nitrite, UA: NEGATIVE
Protein,UA: NEGATIVE
RBC, UA: NEGATIVE
Specific Gravity, UA: 1.005 (ref 1.005–1.030)
Urobilinogen, Ur: 0.2 mg/dL (ref 0.2–1.0)
pH, UA: 6.5 (ref 5.0–7.5)

## 2024-04-13 LAB — TSH: TSH: 1.23 u[IU]/mL (ref 0.450–4.500)

## 2024-04-13 LAB — PARATHYROID HORMONE, INTACT (NO CA): PTH: 53 pg/mL (ref 15–65)

## 2024-04-13 LAB — LIPID PANEL
Chol/HDL Ratio: 2.4 ratio (ref 0.0–4.4)
Cholesterol, Total: 180 mg/dL (ref 100–199)
HDL: 75 mg/dL (ref >39)
LDL Chol Calc (NIH): 86 mg/dL (ref 0–99)
Triglycerides: 111 mg/dL (ref 0–149)
VLDL Cholesterol Cal: 19 mg/dL (ref 5–40)

## 2024-05-10 ENCOUNTER — Other Ambulatory Visit: Payer: Self-pay | Admitting: Internal Medicine

## 2024-05-10 DIAGNOSIS — E7800 Pure hypercholesterolemia, unspecified: Secondary | ICD-10-CM

## 2024-05-10 NOTE — Telephone Encounter (Unsigned)
 Copied from CRM #[id number]. Topic: Clinical - Medication Refill >> May 10, 2024  8:51 AM Avram MATSU wrote: Medication: simvastatin  (ZOCOR ) 10 MG tablet [[id number]]  Has the patient contacted their pharmacy? No (Agent: If no, request that the patient contact the pharmacy for the refill. If patient does not wish to contact the pharmacy document the reason why and proceed with request.) (Agent: If yes, when and what did the pharmacy advise?)  This is the patient's preferred pharmacy:  [id number], [place] - [address] AT [facility] [address] [place] [address] Phone: [phone] Fax: [phone]  Is this the correct pharmacy for this prescription? Yes If no, delete pharmacy and type the correct one.   Has the prescription been filled recently? No  Is the patient out of the medication? No 16 pills left  Has the patient been seen for an appointment in the last year OR does the patient have an upcoming appointment? Yes  Can we respond through MyChart? No  Agent: Please be advised that Rx refills may take up to 3 business days. We ask that you follow-up with your pharmacy.

## 2024-05-11 NOTE — Telephone Encounter (Signed)
 Rx 04/11/24 #90 1RF- duplicate request Requested Prescriptions  Pending Prescriptions Disp Refills   simvastatin  (ZOCOR ) 10 MG tablet 90 tablet 1    Sig: Take 1 tablet (10 mg total) by mouth daily.     Cardiovascular:  Antilipid - Statins Failed - 05/11/2024  4:11 PM      Failed - Lipid Panel in normal range within the last 12 months    Cholesterol, Total  Date Value Ref Range Status  04/11/2024 180 100 - 199 mg/dL Final   LDL Chol Calc (NIH)  Date Value Ref Range Status  04/11/2024 86 0 - 99 mg/dL Final   HDL  Date Value Ref Range Status  04/11/2024 75 >39 mg/dL Final   Triglycerides  Date Value Ref Range Status  04/11/2024 111 0 - 149 mg/dL Final         Passed - Patient is not pregnant      Passed - Valid encounter within last 12 months    Recent Outpatient Visits           1 month ago Annual physical exam   [place] [facility] at [facility], Leita DEL, MD   7 months ago Encounter for screening mammogram for breast cancer   [facility] at [facility], Leita DEL, MD

## 2024-06-26 ENCOUNTER — Other Ambulatory Visit: Payer: Self-pay | Admitting: Internal Medicine

## 2024-06-26 DIAGNOSIS — E7800 Pure hypercholesterolemia, unspecified: Secondary | ICD-10-CM

## 2024-06-27 NOTE — Telephone Encounter (Signed)
 Requested Prescriptions  Pending Prescriptions Disp Refills   verapamil  (CALAN -SR) 180 MG CR tablet [Pharmacy Med Name: VERAPAMIL  ER 180MG  TABLETS] 90 tablet 1    Sig: TAKE 1 TABLET(180 MG) BY MOUTH AT BEDTIME     Cardiovascular: Calcium Channel Blockers 3 Passed - 06/27/2024  5:24 PM      Passed - ALT in normal range and within 360 days    ALT  Date Value Ref Range Status  04/11/2024 8 0 - 32 IU/L Final         Passed - AST in normal range and within 360 days    AST  Date Value Ref Range Status  04/11/2024 18 0 - 40 IU/L Final         Passed - Cr in normal range and within 360 days    Creatinine, Ser  Date Value Ref Range Status  04/11/2024 0.64 0.57 - 1.00 mg/dL Final         Passed - Last BP in normal range    BP Readings from Last 1 Encounters:  04/11/24 126/70         Passed - Last Heart Rate in normal range    Pulse Readings from Last 1 Encounters:  04/11/24 76         Passed - Valid encounter within last 6 months    Recent Outpatient Visits           2 months ago Annual physical exam   [place] [facility] at [facility], Leita DEL, MD   8 months ago Encounter for screening mammogram for breast cancer   [place] [facility] at [facility], Leita DEL, MD              Refused Prescriptions Disp Refills   simvastatin  (ZOCOR ) 10 MG tablet [Pharmacy Med Name: SIMVASTATIN  10MG  TABLETS] 90 tablet 1    Sig: TAKE 1 TABLET(10 MG) BY MOUTH DAILY     Cardiovascular:  Antilipid - Statins Failed - 06/27/2024  5:24 PM      Failed - Lipid Panel in normal range within the last 12 months    Cholesterol, Total  Date Value Ref Range Status  04/11/2024 180 100 - 199 mg/dL Final   LDL Chol Calc (NIH)  Date Value Ref Range Status  04/11/2024 86 0 - 99 mg/dL Final   HDL  Date Value Ref Range Status  04/11/2024 75 >39 mg/dL Final   Triglycerides  Date Value Ref Range Status  04/11/2024 111 0 - 149  mg/dL Final         Passed - Patient is not pregnant      Passed - Valid encounter within last 12 months    Recent Outpatient Visits           2 months ago Annual physical exam   [place] [facility] at [facility], Leita DEL, MD   8 months ago Encounter for screening mammogram for breast cancer   [facility] at [facility], Leita DEL, MD

## 2024-07-01 ENCOUNTER — Other Ambulatory Visit: Payer: Self-pay | Admitting: Internal Medicine

## 2024-07-01 DIAGNOSIS — I10 Essential (primary) hypertension: Secondary | ICD-10-CM

## 2024-07-05 NOTE — Telephone Encounter (Signed)
 Requested Prescriptions  Pending Prescriptions Disp Refills   losartan  (COZAAR ) 100 MG tablet [Pharmacy Med Name: LOSARTAN  100MG  TABLETS] 90 tablet 1    Sig: TAKE 1 TABLET(100 MG) BY MOUTH DAILY     Cardiovascular:  Angiotensin Receptor Blockers Passed - 07/05/2024  3:54 PM      Passed - Cr in normal range and within 180 days    Creatinine, Ser  Date Value Ref Range Status  04/11/2024 0.64 0.57 - 1.00 mg/dL Final         Passed - K in normal range and within 180 days    Potassium  Date Value Ref Range Status  04/11/2024 4.2 3.5 - 5.2 mmol/L Final         Passed - Patient is not pregnant      Passed - Last BP in normal range    BP Readings from Last 1 Encounters:  04/11/24 126/70         Passed - Valid encounter within last 6 months    Recent Outpatient Visits           2 months ago Annual physical exam   [place] [facility] at [facility], Leita DEL, MD   9 months ago Encounter for screening mammogram for breast cancer   [facility] at [facility], Leita DEL, MD

## 2024-08-15 ENCOUNTER — Other Ambulatory Visit: Payer: Self-pay | Admitting: Internal Medicine

## 2024-08-15 DIAGNOSIS — E7800 Pure hypercholesterolemia, unspecified: Secondary | ICD-10-CM

## 2024-08-15 NOTE — Telephone Encounter (Unsigned)
 Copied from CRM #[id number]. Topic: Clinical - Medication Refill >> Aug 15, 2024 12:25 PM Amy B wrote: Medication: simvastatin  (ZOCOR ) 10 MG tablet  Has the patient contacted their pharmacy? No (Agent: If no, request that the patient contact the pharmacy for the refill. If patient does not wish to contact the pharmacy document the reason why and proceed with request.) (Agent: If yes, when and what did the pharmacy advise?)  This is the patient's preferred pharmacy:  [id number] [place], [place] - [address]  [phone]  Is this the correct pharmacy for this prescription? Yes If no, delete pharmacy and type the correct one.   Has the prescription been filled recently? No  Is the patient out of the medication? No  Has the patient been seen for an appointment in the last year OR does the patient have an upcoming appointment? Yes  Can we respond through MyChart? No  Agent: Please be advised that Rx refills may take up to 3 business days. We ask that you follow-up with your pharmacy.

## 2024-08-16 NOTE — Telephone Encounter (Signed)
 Rx 04/11/24 #90 1RF- too soon Requested Prescriptions  Pending Prescriptions Disp Refills   simvastatin  (ZOCOR ) 10 MG tablet 90 tablet 1    Sig: Take 1 tablet (10 mg total) by mouth daily.     Cardiovascular:  Antilipid - Statins Failed - 08/16/2024  1:37 PM      Failed - Lipid Panel in normal range within the last 12 months    Cholesterol, Total  Date Value Ref Range Status  04/11/2024 180 100 - 199 mg/dL Final   LDL Chol Calc (NIH)  Date Value Ref Range Status  04/11/2024 86 0 - 99 mg/dL Final   HDL  Date Value Ref Range Status  04/11/2024 75 >39 mg/dL Final   Triglycerides  Date Value Ref Range Status  04/11/2024 111 0 - 149 mg/dL Final         Passed - Patient is not pregnant      Passed - Valid encounter within last 12 months    Recent Outpatient Visits           4 months ago Annual physical exam   [place] [facility] at [facility], Leita DEL, MD   10 months ago Encounter for screening mammogram for breast cancer   [facility] at [facility], Leita DEL, MD

## 2024-10-05 ENCOUNTER — Encounter: Admitting: Student

## 2024-10-12 ENCOUNTER — Encounter: Payer: Medicare (Managed Care) | Admitting: Student

## 2024-10-20 ENCOUNTER — Ambulatory Visit: Payer: Medicare (Managed Care)
